# Patient Record
Sex: Male | Born: 1985
Health system: Southern US, Community
[De-identification: ages and names within clinical notes are randomized; demographics above are authoritative.]

---
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
## 2013-12-16 ENCOUNTER — Ambulatory Visit (INDEPENDENT_AMBULATORY_CARE_PROVIDER_SITE_OTHER): Payer: BC Managed Care – PPO

## 2013-12-16 ENCOUNTER — Encounter: Payer: Self-pay | Admitting: Sports Medicine

## 2013-12-16 ENCOUNTER — Ambulatory Visit (INDEPENDENT_AMBULATORY_CARE_PROVIDER_SITE_OTHER): Payer: BC Managed Care – PPO | Admitting: Sports Medicine

## 2013-12-16 VITALS — BP 130/80 | HR 67 | Ht 75.0 in | Wt 206.0 lb

## 2013-12-16 DIAGNOSIS — Z23 Encounter for immunization: Secondary | ICD-10-CM

## 2013-12-16 DIAGNOSIS — Z299 Encounter for prophylactic measures, unspecified: Secondary | ICD-10-CM

## 2013-12-16 DIAGNOSIS — Z136 Encounter for screening for cardiovascular disorders: Secondary | ICD-10-CM

## 2013-12-16 DIAGNOSIS — Z0000 Encounter for general adult medical examination without abnormal findings: Secondary | ICD-10-CM | POA: Insufficient documentation

## 2013-12-16 DIAGNOSIS — Z0289 Encounter for other administrative examinations: Secondary | ICD-10-CM

## 2013-12-16 LAB — POCT URINALYSIS DIPSTICK
Bilirubin, UA: NEGATIVE
Blood, UA: NEGATIVE
Glucose, UA: NEGATIVE
Ketones, UA: NEGATIVE
Leukocytes, UA: NEGATIVE
Nitrite, UA: NEGATIVE
Protein, UA: NEGATIVE
Spec Grav, UA: 1.02
Urobilinogen, UA: 0.2
pH, UA: 7.5

## 2013-12-16 NOTE — Assessment & Plan Note (Addendum)
Currently in [facility] training. Physical performed today. He will need urinalysis, chest x-ray, Tdap, hearing and vision test, EKG. I can see him back in one year.

## 2013-12-16 NOTE — Progress Notes (Signed)
  Subjective:    CC: Establish care.   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 He needs a complete physical, with some routine testing. He has no complaints, and no issues.  Past medical history, Surgical history, Family history not pertinant except as noted below, Social history, Allergies, and medications have been entered into the medical record, reviewed, and no changes needed.   Review of Systems: No headache, visual changes, nausea, vomiting, diarrhea, constipation, dizziness, abdominal pain, skin rash, fevers, chills, night sweats, swollen lymph nodes, weight loss, chest pain, body aches, joint swelling, muscle aches, shortness of breath, mood changes, visual or auditory hallucinations.  Objective:    General: Well Developed, well nourished, and in no acute distress.  Neuro: Alert and oriented x3, extra-ocular muscles intact, sensation grossly intact.  HEENT: Normocephalic, atraumatic, pupils equal round reactive to light, neck supple, no masses, no lymphadenopathy, thyroid nonpalpable.  Skin: Warm and dry, no rashes noted.  Cardiac: Regular rate and rhythm, no murmurs rubs or gallops.  Respiratory: Clear to auscultation bilaterally. Not using accessory muscles, speaking in full sentences.  Abdominal: Soft, nontender, nondistended, positive bowel sounds, no masses, no organomegaly.  Musculoskeletal: Shoulder, elbow, wrist, hip, knee, ankle stable, and with full range of motion.  Urinalysis is negative, twelve-lead EKG shows normal sinus rhythm, hearing, and vision are within normal range.  Chest x-ray is negative.   Impression and Recommendations:    The patient was counselled, risk factors were discussed, anticipatory guidance given.

## 2014-01-14 ENCOUNTER — Encounter: Payer: Self-pay | Admitting: Sports Medicine

## 2014-11-25 IMAGING — CR DG CHEST 2V
2 series · 2 of 2 positions shown · non-contrast
Comparison: None.

CLINICAL DATA: Employment screening.

EXAM:
CHEST  2 VIEW

[view not recorded (1 of 2)]
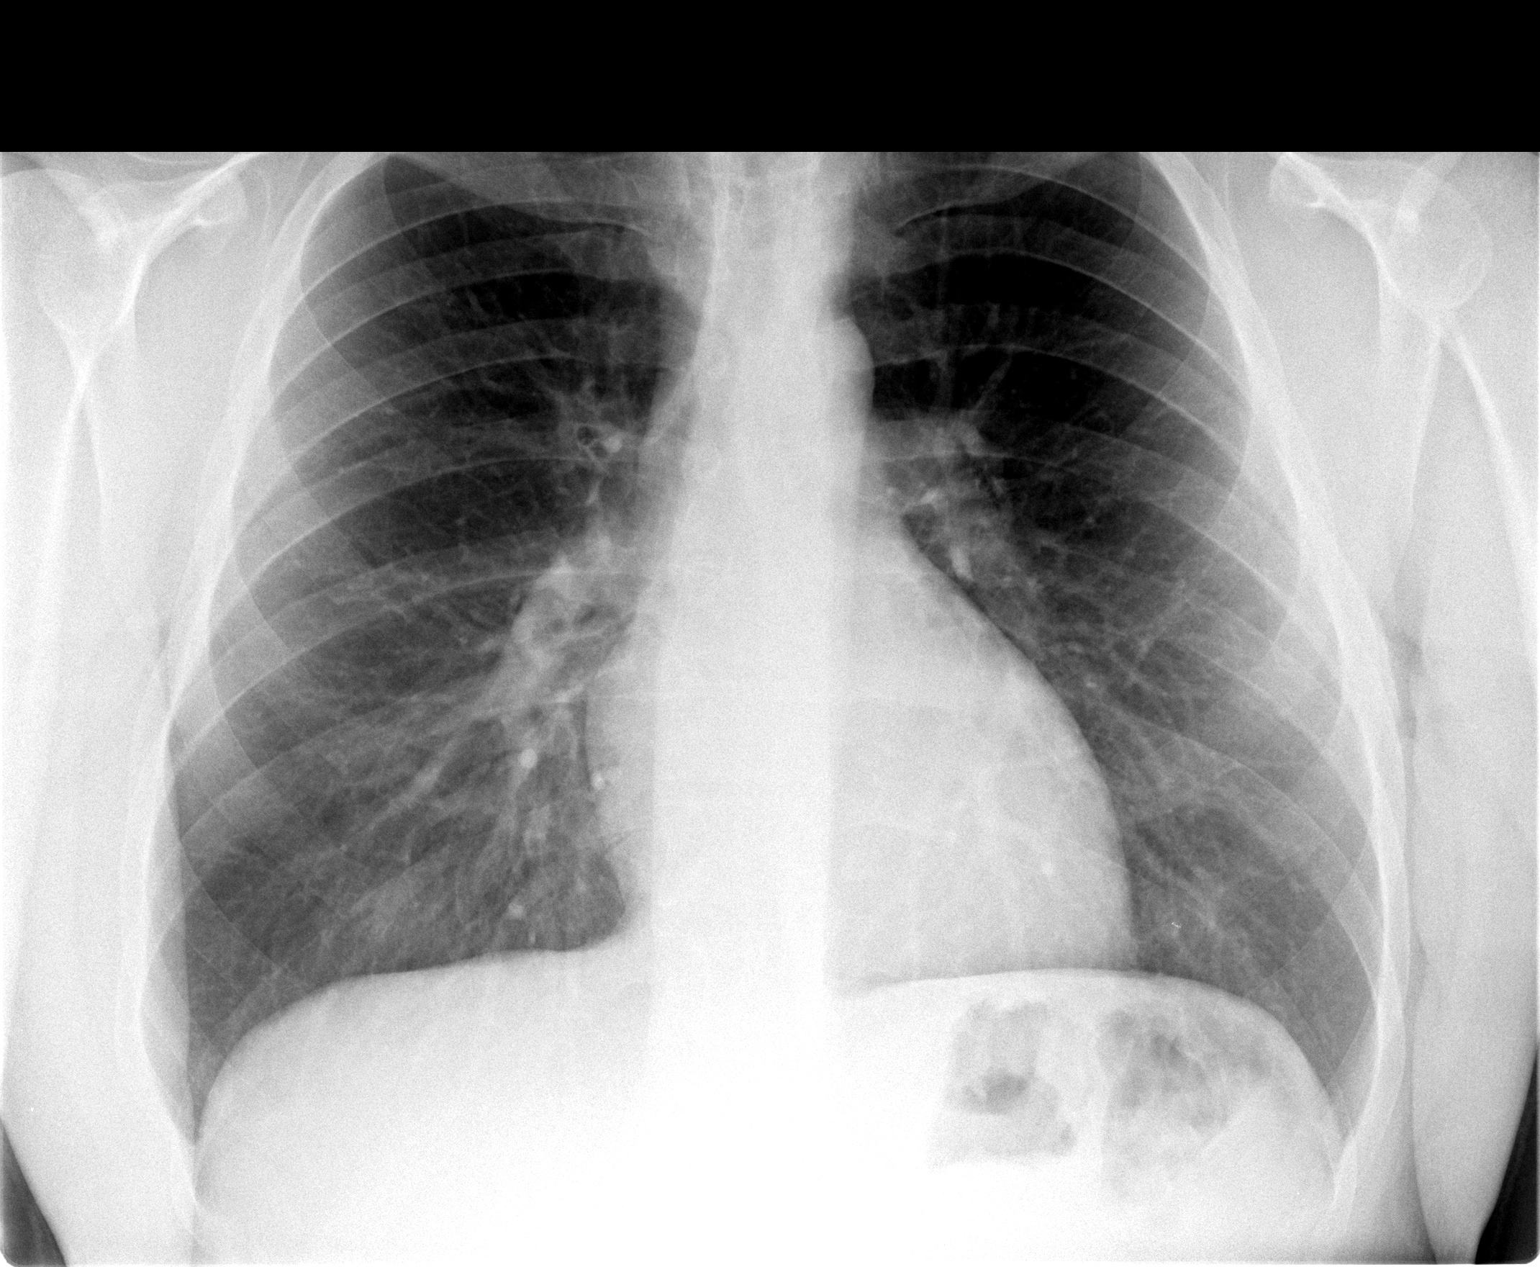

[view not recorded (2 of 2)]
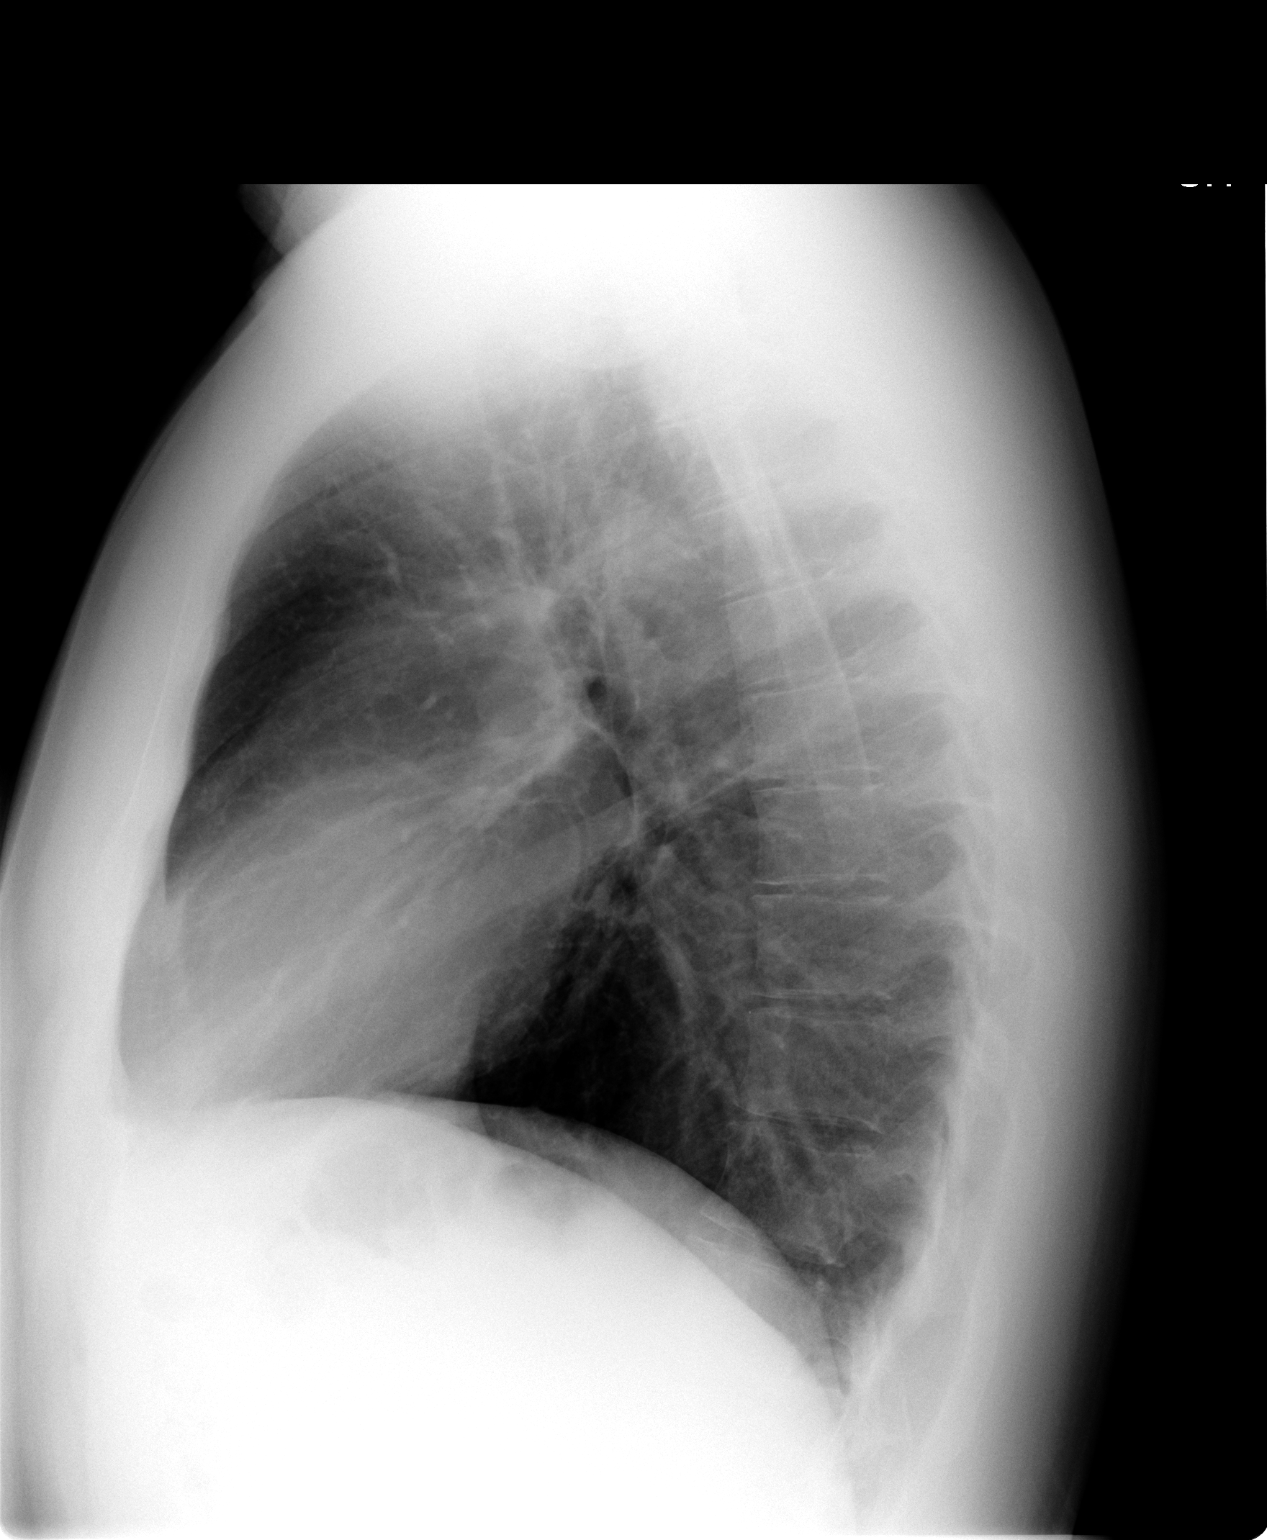

[2 of 2 positions shown; findings below may reference images not displayed]

FINDINGS: The heart size and mediastinal contours are within normal limits.
Both lungs are clear. The visualized skeletal structures are
unremarkable.
IMPRESSION: Normal exam.

## 2014-12-15 ENCOUNTER — Ambulatory Visit (INDEPENDENT_AMBULATORY_CARE_PROVIDER_SITE_OTHER): Payer: BC Managed Care – PPO | Admitting: Physician Assistant

## 2014-12-15 ENCOUNTER — Encounter: Payer: Self-pay | Admitting: Physician Assistant

## 2014-12-15 VITALS — BP 128/82 | HR 71 | Ht 75.0 in | Wt 213.0 lb

## 2014-12-15 DIAGNOSIS — Z299 Encounter for prophylactic measures, unspecified: Secondary | ICD-10-CM

## 2014-12-15 DIAGNOSIS — Z418 Encounter for other procedures for purposes other than remedying health state: Secondary | ICD-10-CM | POA: Diagnosis not present

## 2014-12-15 NOTE — Patient Instructions (Signed)

## 2014-12-15 NOTE — Progress Notes (Signed)
   Subjective:    Patient ID: Patrick Whitehead, male    DOB: 1986-08-10, 28 y.o.   MRN: [id number]  HPI Pt presents to the clinic for CPE for work. No problems or concerns. Feels great.    Review of Systems  All other systems reviewed and are negative.      Objective:   Physical Exam  Constitutional: He is oriented to person, place, and time. He appears well-developed and well-nourished.  HENT:  Head: Normocephalic and atraumatic.  Right Ear: External ear normal.  Left Ear: External ear normal.  Nose: Nose normal.  Mouth/Throat: Oropharynx is clear and moist. No oropharyngeal exudate.  Eyes: Conjunctivae and EOM are normal. Pupils are equal, round, and reactive to light. Right eye exhibits no discharge. Left eye exhibits no discharge.  Neck: Normal range of motion. Neck supple. No thyromegaly present.  Cardiovascular: Normal rate, regular rhythm and normal heart sounds.   Pulmonary/Chest: Effort normal and breath sounds normal. He has no wheezes.  Abdominal: Soft. Bowel sounds are normal. He exhibits no distension and no mass. There is no tenderness. There is no rebound and no guarding.  Musculoskeletal: Normal range of motion.  Lymphadenopathy:    He has no cervical adenopathy.  Neurological: He is alert and oriented to person, place, and time. He has normal reflexes. No cranial nerve deficit. Coordination normal.  Skin: Skin is dry.  Psychiatric: He has a normal mood and affect. His behavior is normal.          Assessment & Plan:  CPE- pt declines flu shot. He had blood work done at work. Discussed with patient to have copy sent to [place] to scan into system. Tdap up to date. HO given. No problems or concerns. Follow up in 1 year.

## 2015-12-10 ENCOUNTER — Encounter: Payer: Self-pay | Admitting: Sports Medicine

## 2015-12-10 ENCOUNTER — Ambulatory Visit (INDEPENDENT_AMBULATORY_CARE_PROVIDER_SITE_OTHER): Payer: BLUE CROSS/BLUE SHIELD | Admitting: Sports Medicine

## 2015-12-10 VITALS — BP 121/84 | HR 73 | Temp 98.3°F | Resp 18 | Ht 75.0 in | Wt 211.3 lb

## 2015-12-10 DIAGNOSIS — Z299 Encounter for prophylactic measures, unspecified: Secondary | ICD-10-CM | POA: Diagnosis not present

## 2015-12-10 NOTE — Assessment & Plan Note (Signed)
Annual physical performed as above, patient has blood work performed by the department. He will for this to me in January. 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

## 2015-12-10 NOTE — Progress Notes (Signed)
  Subjective:    CC: CPE  HPI:  This is a pleasant and healthy 29 year old male with no complaints, He is simply here for a physical exam.he declines getting any vaccinations.  Past medical history, Surgical history, Family history not pertinant except as noted below, Social history, Allergies, and medications have been entered into the medical record, reviewed, and no changes needed.   Review of Systems: No headache, visual changes, nausea, vomiting, diarrhea, constipation, dizziness, abdominal pain, skin rash, fevers, chills, night sweats, swollen lymph nodes, weight loss, chest pain, body aches, joint swelling, muscle aches, shortness of breath, mood changes, visual or auditory hallucinations.  Objective:   General: Well Developed, well nourished, and in no acute distress.  Neuro: Alert and oriented x3, extra-ocular muscles intact, sensation grossly intact. Cranial nerves II through XII are intact, motor, sensory, and coordinative functions are all intact. HEENT: Normocephalic, atraumatic, pupils equal round reactive to light, neck supple, no masses, no lymphadenopathy, thyroid nonpalpable. Oropharynx, nasopharynx, external ear canals are unremarkable. Skin: Warm and dry, no rashes noted.  Cardiac: Regular rate and rhythm, no murmurs rubs or gallops.  Respiratory: Clear to auscultation bilaterally. Not using accessory muscles, speaking in full sentences.  Abdominal: Soft, nontender, nondistended, positive bowel sounds, no masses, no organomegaly.  Musculoskeletal: Shoulder, elbow, wrist, hip, knee, ankle stable, and with full range of motion.  Impression and Recommendations:    The patient was counselled, risk factors were discussed, anticipatory guidance given.

## 2016-07-04 ENCOUNTER — Ambulatory Visit (INDEPENDENT_AMBULATORY_CARE_PROVIDER_SITE_OTHER): Payer: BLUE CROSS/BLUE SHIELD | Admitting: Sports Medicine

## 2016-07-04 ENCOUNTER — Encounter: Payer: Self-pay | Admitting: Sports Medicine

## 2016-07-04 VITALS — BP 129/86 | HR 69 | Resp 18 | Wt 235.6 lb

## 2016-07-04 DIAGNOSIS — Z0000 Encounter for general adult medical examination without abnormal findings: Secondary | ICD-10-CM

## 2016-07-04 NOTE — Progress Notes (Signed)
Patient ID: Patrick Whitehead, male   DOB: 30-Apr-1986, 30 y.o.   MRN: [id number]  Subjective:    CC: Complete physical  HPI: Presenting to clinic for one year complete physical check up.  No complaints at this time.   Past medical history, Surgical history, Family history not pertinant except as noted below, Social history, Allergies, and medications have been entered into the medical record, reviewed, and no changes needed.   Review of Systems: No headache, visual changes, nausea, vomiting, diarrhea, constipation, dizziness, abdominal pain, skin rash, fevers, chills, night sweats, swollen lymph nodes, weight loss, chest pain, body aches, joint swelling, muscle aches, shortness of breath, mood changes, visual or auditory hallucinations.  Objective:    General: Well Developed, well nourished, and in no acute distress.  Neuro: Alert and oriented x3, extra-ocular muscles intact, sensation grossly intact.  HEENT: Normocephalic, atraumatic, pupils equal round reactive to light, neck supple, no masses, no lymphadenopathy, thyroid nonpalpable.  External auditory canals clear.  No pharyngeal exudates.    Skin: Warm and dry, no rashes noted.  Cardiac: Regular rate and rhythm, no murmurs rubs or gallops.  Respiratory: Clear to auscultation bilaterally. Not using accessory muscles, speaking in full sentences.  Abdominal: Soft, nontender, nondistended, positive bowel sounds, no masses, no organomegaly.  Musculoskeletal: Shoulder, elbow, wrist, hip, knee, ankle stable, and with full range of motion.  Impression and Recommendations:    The patient was counselled, risk factors were discussed, anticipatory guidance given.  Recent bloodwork from work was normal.  Will return in one year for yearly physical for work.

## 2016-07-04 NOTE — Assessment & Plan Note (Signed)
CPE as above, gets labs done at his employer, return in 1 year, all labs normal. .

## 2016-07-13 DIAGNOSIS — Z0100 Encounter for examination of eyes and vision without abnormal findings: Secondary | ICD-10-CM | POA: Diagnosis not present

## 2017-08-03 ENCOUNTER — Ambulatory Visit (INDEPENDENT_AMBULATORY_CARE_PROVIDER_SITE_OTHER): Payer: BLUE CROSS/BLUE SHIELD | Admitting: Sports Medicine

## 2017-08-03 ENCOUNTER — Encounter: Payer: Self-pay | Admitting: Sports Medicine

## 2017-08-03 DIAGNOSIS — Z0000 Encounter for general adult medical examination without abnormal findings: Secondary | ICD-10-CM | POA: Diagnosis not present

## 2017-08-03 DIAGNOSIS — Z3009 Encounter for other general counseling and advice on contraception: Secondary | ICD-10-CM | POA: Diagnosis not present

## 2017-08-03 DIAGNOSIS — D229 Melanocytic nevi, unspecified: Secondary | ICD-10-CM

## 2017-08-03 DIAGNOSIS — L989 Disorder of the skin and subcutaneous tissue, unspecified: Secondary | ICD-10-CM | POA: Insufficient documentation

## 2017-08-03 DIAGNOSIS — D239 Other benign neoplasm of skin, unspecified: Secondary | ICD-10-CM | POA: Insufficient documentation

## 2017-08-03 NOTE — Assessment & Plan Note (Signed)
Physical exam today. His employer does his cholesterol screening, his numbers were normal from February.  Return in one year.

## 2017-08-03 NOTE — Progress Notes (Signed)
  Subjective:    CC: Annual physical   HPI:  Patrick Whitehead is here for a physical, he has no complaints.  Family planning: Has 2 kids, would like a vasectomy.  Past medical history:  Negative.  See flowsheet/record as well for more information.  Surgical history: Negative.  See flowsheet/record as well for more information.  Family history: Negative.  See flowsheet/record as well for more information.  Social history: Negative.  See flowsheet/record as well for more information.  Allergies, and medications have been entered into the medical record, reviewed, and no changes needed.    Review of Systems: No headache, visual changes, nausea, vomiting, diarrhea, constipation, dizziness, abdominal pain, skin rash, fevers, chills, night sweats, swollen lymph nodes, weight loss, chest pain, body aches, joint swelling, muscle aches, shortness of breath, mood changes, visual or auditory hallucinations.  Objective:    General: Well Developed, well nourished, and in no acute distress.  Neuro: Alert and oriented x3, extra-ocular muscles intact, sensation grossly intact. Cranial nerves II through XII are intact, motor, sensory, and coordinative functions are all intact. HEENT: Normocephalic, atraumatic, pupils equal round reactive to light, neck supple, no masses, no lymphadenopathy, thyroid nonpalpable. Oropharynx, nasopharynx, external ear canals are unremarkable. Skin: Warm and dry, no rashes noted. On the left upper back there is a 1.5 cm variegated hyperpigmented likely atypical nevus. Cardiac: Regular rate and rhythm, no murmurs rubs or gallops.  Respiratory: Clear to auscultation bilaterally. Not using accessory muscles, speaking in full sentences.  Abdominal: Soft, nontender, nondistended, positive bowel sounds, no masses, no organomegaly.  Musculoskeletal: Shoulder, elbow, wrist, hip, knee, ankle stable, and with full range of motion.  Impression and Recommendations:    The patient was counselled,  risk factors were discussed, anticipatory guidance given.  Annual physical exam Physical exam today. His employer does his cholesterol screening, his numbers were normal from February.  Return in one year.  Nevus Located on the back, somewhat atypical appearing. Return for shave biopsy.  Family planning Referral for vasectomy per patient request.

## 2017-08-03 NOTE — Assessment & Plan Note (Signed)
Referral for vasectomy per patient request.

## 2017-08-03 NOTE — Assessment & Plan Note (Signed)
Located on the back, somewhat atypical appearing. Return for shave biopsy.

## 2017-08-09 ENCOUNTER — Encounter: Payer: Self-pay | Admitting: Sports Medicine

## 2017-08-09 ENCOUNTER — Ambulatory Visit (INDEPENDENT_AMBULATORY_CARE_PROVIDER_SITE_OTHER): Payer: BLUE CROSS/BLUE SHIELD | Admitting: Sports Medicine

## 2017-08-09 DIAGNOSIS — D225 Melanocytic nevi of trunk: Secondary | ICD-10-CM | POA: Diagnosis not present

## 2017-08-09 DIAGNOSIS — D229 Melanocytic nevi, unspecified: Secondary | ICD-10-CM

## 2017-08-09 MED ORDER — HYDROCODONE-ACETAMINOPHEN 5-325 MG PO TABS: 1.0000 | ORAL_TABLET | Freq: Three times a day (TID) | ORAL | 0 refills | Status: DC | PRN

## 2017-08-09 NOTE — Progress Notes (Signed)
   Procedure:  Excision of 2cm atypical nevus left upper back Risks, benefits, and alternatives explained and consent obtained. Time out conducted. Surface prepped with alcohol. 5cc lidocaine with epinephine infiltrated in a field block. Adequate anesthesia ensured. Area prepped and draped in a sterile fashion. Excision performed with: Elliptical incision with 1 cm margins made around the lesion, I carried the dissection down into the subcutaneous tissues, the lesion was removed with a suture marking the medial border. I then closed the incision with 3-0 running subcuticular Vicryl sutures.  Hemostasis achieved. Pt stable.

## 2017-08-09 NOTE — Addendum Note (Signed)
Addended by: Elizabeth Sauer on: 08/09/2017 11:03 AM   Modules accepted: Orders

## 2017-08-09 NOTE — Assessment & Plan Note (Signed)
Surgical removal, awaiting pathology results return in one week for a wound check, hydrocodone for postprocedural pain.Marland Kitchen

## 2017-08-16 ENCOUNTER — Ambulatory Visit (INDEPENDENT_AMBULATORY_CARE_PROVIDER_SITE_OTHER): Payer: BLUE CROSS/BLUE SHIELD | Admitting: Sports Medicine

## 2017-08-16 ENCOUNTER — Encounter: Payer: Self-pay | Admitting: Sports Medicine

## 2017-08-16 DIAGNOSIS — D239 Other benign neoplasm of skin, unspecified: Secondary | ICD-10-CM

## 2017-08-16 NOTE — Progress Notes (Signed)
.   Subjective: 1 week post surgical excision with primary closure. Owing well.   Objective: General: Well-developed, well-nourished, and in no acute distress. Incision: Clean, dry, intact. Skin: There is a number lesion, 4 cm across, pigmented but with clear margins, non-variegated.    Assessment/plan:   Dysplastic nevus Pathology results showed full excision with clear margins of a dysplastic nevus with severe atypia. Incision looks good today, he did have another lesion on his right posterior upper shoulder, he will keep an eye on this. If it changes over 6 months we will do an excisional biopsy.

## 2017-08-16 NOTE — Assessment & Plan Note (Signed)
Pathology results showed full excision with clear margins of a dysplastic nevus with severe atypia. Incision looks good today, he did have another lesion on his right posterior upper shoulder, he will keep an eye on this. If it changes over 6 months we will do an excisional biopsy.

## 2017-10-05 DIAGNOSIS — Z3009 Encounter for other general counseling and advice on contraception: Secondary | ICD-10-CM | POA: Diagnosis not present

## 2018-01-16 DIAGNOSIS — Z302 Encounter for sterilization: Secondary | ICD-10-CM | POA: Diagnosis not present

## 2018-04-03 ENCOUNTER — Encounter: Payer: BLUE CROSS/BLUE SHIELD | Admitting: Sports Medicine

## 2018-04-10 ENCOUNTER — Ambulatory Visit (INDEPENDENT_AMBULATORY_CARE_PROVIDER_SITE_OTHER): Payer: BLUE CROSS/BLUE SHIELD

## 2018-04-10 ENCOUNTER — Encounter: Payer: Self-pay | Admitting: Sports Medicine

## 2018-04-10 ENCOUNTER — Ambulatory Visit (INDEPENDENT_AMBULATORY_CARE_PROVIDER_SITE_OTHER): Payer: BLUE CROSS/BLUE SHIELD | Admitting: Sports Medicine

## 2018-04-10 DIAGNOSIS — Z021 Encounter for pre-employment examination: Secondary | ICD-10-CM | POA: Diagnosis not present

## 2018-04-10 DIAGNOSIS — Z0000 Encounter for general adult medical examination without abnormal findings: Secondary | ICD-10-CM | POA: Diagnosis not present

## 2018-04-10 DIAGNOSIS — R7989 Other specified abnormal findings of blood chemistry: Secondary | ICD-10-CM | POA: Diagnosis not present

## 2018-04-10 NOTE — Progress Notes (Signed)
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  He has no complaints.  I reviewed the past medical history, family history, social history, surgical history, and allergies today and no changes were needed.  Please see the problem list section below in epic for further details.  Past Medical History: No past medical history on file. Past Surgical History: Past Surgical History:  Procedure Laterality Date  . TONSILLECTOMY  2000   Social History: Social History   Socioeconomic History  . Marital status: Married    Spouse name: Not on file  . Number of children: Not on file  . Years of education: Not on file  . Highest education level: Not on file  Occupational History  . Not on file  Social Needs  . Financial resource strain: Not on file  . Food insecurity:    Worry: Not on file    Inability: Not on file  . Transportation needs:    Medical: Not on file    Non-medical: Not on file  Tobacco Use  . Smoking status: Never Smoker  . Smokeless tobacco: Never Used  Substance and Sexual Activity  . Alcohol use: Not on file  . Drug use: Not on file  . Sexual activity: Not on file  Lifestyle  . Physical activity:    Days per week: Not on file    Minutes per session: Not on file  . Stress: Not on file  Relationships  . Social connections:    Talks on phone: Not on file    Gets together: Not on file    Attends religious service: Not on file    Active member of club or organization: Not on file    Attends meetings of clubs or organizations: Not on file    Relationship status: Not on file  Other Topics Concern  . Not on file  Social History Narrative  . Not on file   Family History: Family History  Problem Relation Age of Onset  . Cancer Paternal Uncle   . Hypertension Maternal Grandmother   . Hypertension  Maternal Grandfather    Allergies: No Known Allergies Medications: See med rec.  Review of Systems: No headache, visual changes, nausea, vomiting, diarrhea, constipation, dizziness, abdominal pain, skin rash, fevers, chills, night sweats, swollen lymph nodes, weight loss, chest pain, body aches, joint swelling, muscle aches, shortness of breath, mood changes, visual or auditory hallucinations.  Objective:    General: Well Developed, well nourished, and in no acute distress.  Neuro: Alert and oriented x3, extra-ocular muscles intact, sensation grossly intact. Cranial nerves II through XII are intact, motor, sensory, and coordinative functions are all intact. HEENT: Normocephalic, atraumatic, pupils equal round reactive to light, neck supple, no masses, no lymphadenopathy, thyroid nonpalpable. Oropharynx, nasopharynx, external ear canals are unremarkable. Skin: Warm and dry, no rashes noted.  Cardiac: Regular rate and rhythm, no murmurs rubs or gallops.  Respiratory: Clear to auscultation bilaterally. Not using accessory muscles, speaking in full sentences.  Abdominal: Soft, nontender, nondistended, positive bowel sounds, no masses, no organomegaly.  Musculoskeletal: Shoulder, elbow, wrist, hip, knee, ankle stable, and with full range of motion.  Normal twelve-lead ECG.  Urinalysis negative except for trace ketones, this can be normal in someone fasting.  [place] color vision testing is normal.  Normal audiometry screening.  Normal distance and near visual acuity testing.  Normal chest x-ray.  Impression and Recommendations:    The patient was counselled, risk factors were discussed, anticipatory guidance given.  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 Return in 1 year. He does tell me his LDL was profoundly elevated, he does not desire to discuss any  cholesterol medication, has come off of the keto diet for now and will have his lipids checked in January and forward the results to me.  ___________________________________________ Gwen Her. Dianah Field, M.D., ABFM., CAQSM. Primary Care and [place] Instructor of [employer] [facility] [employer]

## 2018-04-10 NOTE — Assessment & Plan Note (Signed)
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 Return in 1 year. He does tell me his LDL was profoundly elevated, he does not desire to discuss any cholesterol medication, has come off of the keto diet for now and will have his lipids checked in January and forward the results to me.

## 2018-04-11 LAB — ANEMIA PROFILE B
%SAT: 26 % (ref 15–60)
ABS Retic: 50500 cells/uL (ref 25000–9000)
Basophils Absolute: 18 cells/uL (ref 0–200)
Basophils Relative: 0.4 %
Eosinophils Absolute: 81 cells/uL (ref 15–500)
Eosinophils Relative: 1.8 %
Ferritin: 221 ng/mL (ref 20–345)
Folate: >24 ng/mL
HCT: 43.9 % (ref 38.5–50.0)
Hemoglobin: 14.5 g/dL (ref 13.2–17.1)
Iron: 61 ug/dL (ref 50–180)
Lymphs Abs: 1071 cells/uL (ref 850–3900)
MCH: 28.3 pg (ref 27.0–33.0)
MCHC: 33 g/dL (ref 32.0–36.0)
MCV: 85.6 fL (ref 80.0–100.0)
MPV: 10.6 fL (ref 7.5–12.5)
Monocytes Relative: 8.1 %
Neutro Abs: 2966 {cells}/uL (ref 1500–7800)
Neutrophils Relative %: 65.9 %
Platelets: 179 Thousand/uL (ref 140–400)
RBC: 5.13 10*6/uL (ref 4.20–5.80)
RDW: 12.5 % (ref 11.0–15.0)
Retic Ct Pct: 1 %
TIBC: 231 ug/dL — ABNORMAL LOW (ref 250–425)
Total Lymphocyte: 23.8 %
Vitamin B-12: 1162 pg/mL — ABNORMAL HIGH (ref 200–1100)
WBC mixed population: 365 cells/uL (ref 200–950)
WBC: 4.5 10*3/uL (ref 3.8–10.8)

## 2018-04-11 LAB — COMPREHENSIVE METABOLIC PANEL
AG Ratio: 2.2 (calc) (ref 1.0–2.5)
Albumin: 4.6 g/dL (ref 3.6–5.1)
CO2: 30 mmol/L (ref 20–32)
Chloride: 106 mmol/L (ref 98–110)
Creat: 1.03 mg/dL (ref 0.60–1.35)
Globulin: 2.1 g/dL (calc) (ref 1.9–3.7)
Glucose, Bld: 95 mg/dL (ref 65–99)
Potassium: 5 mmol/L (ref 3.5–5.3)
Total Protein: 6.7 g/dL (ref 6.1–8.1)

## 2018-04-11 LAB — COMPREHENSIVE METABOLIC PANEL WITH GFR
ALT: 15 U/L (ref 9–46)
AST: 14 U/L (ref 10–40)
Alkaline phosphatase (APISO): 40 U/L (ref 40–115)
BUN: 20 mg/dL (ref 7–25)
Calcium: 9.6 mg/dL (ref 8.6–10.3)
Sodium: 141 mmol/L (ref 135–146)
Total Bilirubin: 0.5 mg/dL (ref 0.2–1.2)

## 2018-04-11 LAB — VITAMIN D 25 HYDROXY (VIT D DEFICIENCY, FRACTURES): Vit D, 25-Hydroxy: 49 ng/mL (ref 30–100)

## 2018-04-16 ENCOUNTER — Encounter: Payer: BLUE CROSS/BLUE SHIELD | Admitting: Sports Medicine

## 2018-05-08 ENCOUNTER — Encounter: Payer: Self-pay | Admitting: Sports Medicine

## 2018-05-09 NOTE — Telephone Encounter (Signed)
Leta would you email him the actual decibel values on his hearing test?  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  Thank you so much!

## 2019-03-04 LAB — LIPID PANEL
Cholesterol: 183 (ref 0–200)
HDL: 70 (ref 35–70)
LDL Cholesterol: 103
Triglycerides: 50 (ref 40–160)

## 2019-03-20 IMAGING — DX DG CHEST 2V
2 series · 2 of 2 positions shown · non-contrast
Comparison: 12/16/2013

CLINICAL DATA: Screening, pre-employment

EXAM:
CHEST - 2 VIEW

[chest pa]
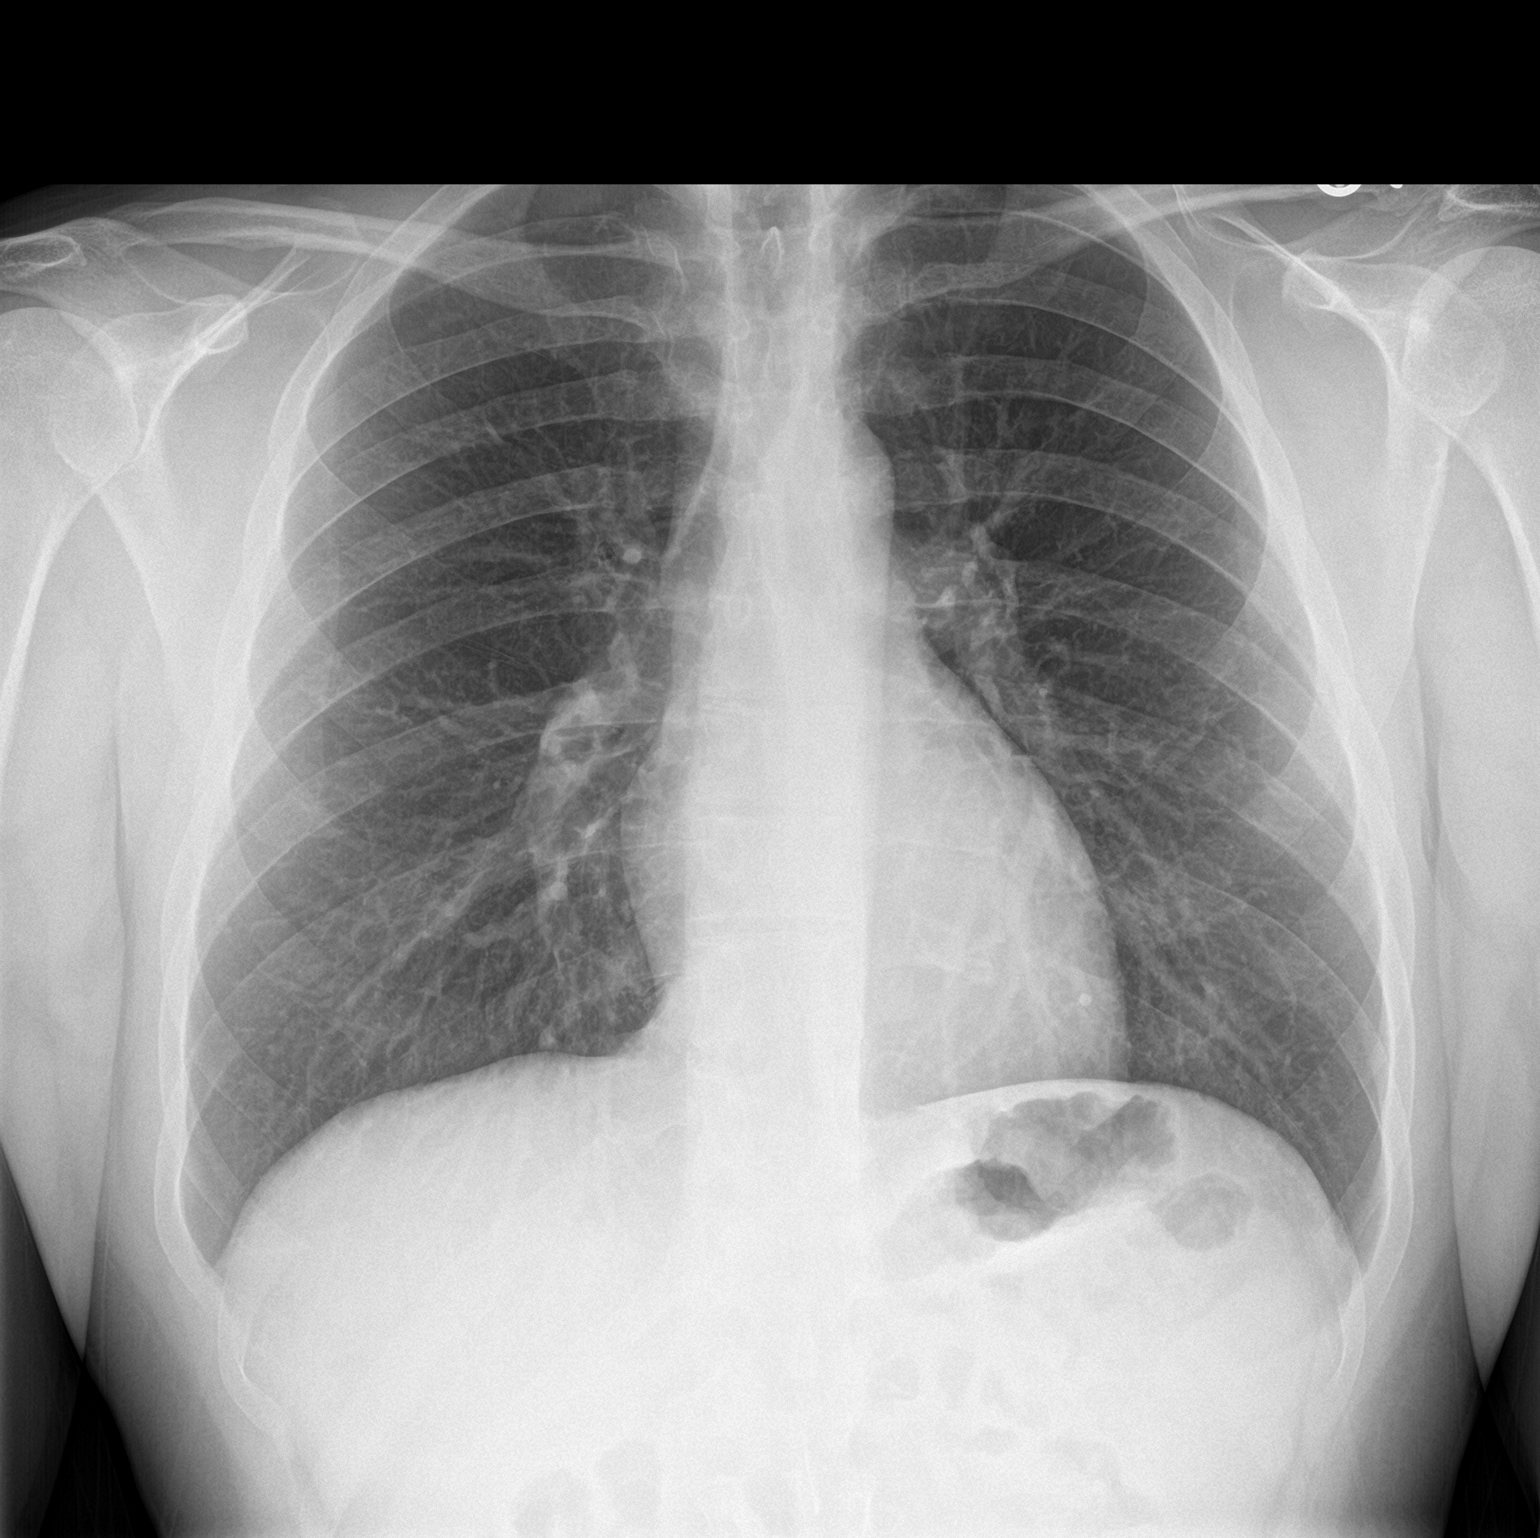

[chest lat]
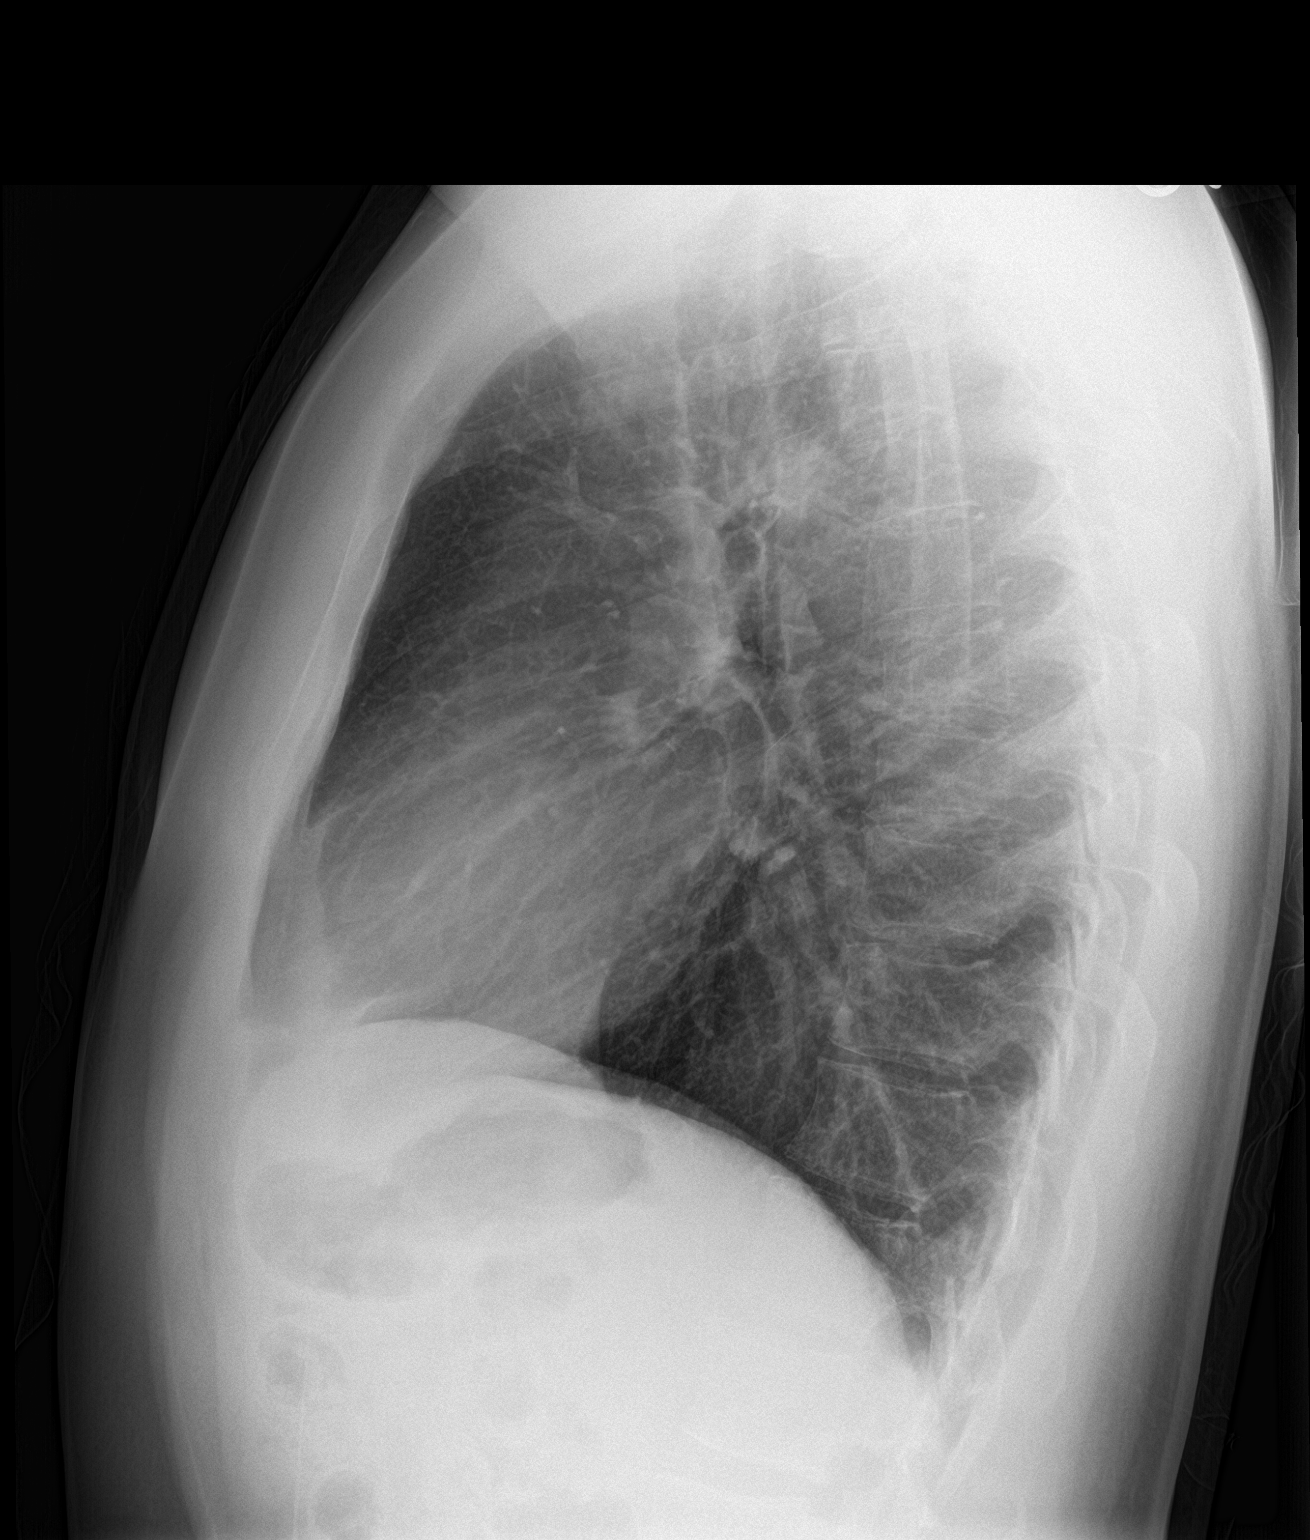

[2 of 2 positions shown; findings below may reference images not displayed]

FINDINGS: Heart and mediastinal contours are within normal limits. No focal
opacities or effusions. No acute bony abnormality.
IMPRESSION: No active cardiopulmonary disease.

## 2019-08-05 ENCOUNTER — Ambulatory Visit (INDEPENDENT_AMBULATORY_CARE_PROVIDER_SITE_OTHER): Payer: BC Managed Care – PPO | Admitting: Sports Medicine

## 2019-08-05 ENCOUNTER — Other Ambulatory Visit: Payer: Self-pay

## 2019-08-05 ENCOUNTER — Encounter: Payer: Self-pay | Admitting: Sports Medicine

## 2019-08-05 DIAGNOSIS — Z0000 Encounter for general adult medical examination without abnormal findings: Secondary | ICD-10-CM | POA: Diagnosis not present

## 2019-08-05 NOTE — Progress Notes (Signed)
Subjective:    CC: Annual physical exam  HPI:  This is a pleasant 33 year old male, he works for the [employer], he is doing well, he is healthy, he has no complaints.  I reviewed the past medical history, family history, social history, surgical history, and allergies today and no changes were needed.  Please see the problem list section below in epic for further details.  Past Medical History: No past medical history on file. Past Surgical History: Past Surgical History:  Procedure Laterality Date  . TONSILLECTOMY  2000   Social History: Social History   Socioeconomic History  . Marital status: Married    Spouse name: Not on file  . Number of children: Not on file  . Years of education: Not on file  . Highest education level: Not on file  Occupational History  . Not on file  Social Needs  . Financial resource strain: Not on file  . Food insecurity    Worry: Not on file    Inability: Not on file  . Transportation needs    Medical: Not on file    Non-medical: Not on file  Tobacco Use  . Smoking status: Never Smoker  . Smokeless tobacco: Never Used  Substance and Sexual Activity  . Alcohol use: Not on file  . Drug use: Not on file  . Sexual activity: Not on file  Lifestyle  . Physical activity    Days per week: Not on file    Minutes per session: Not on file  . Stress: Not on file  Relationships  . Social Herbalist on phone: Not on file    Gets together: Not on file    Attends religious service: Not on file    Active member of club or organization: Not on file    Attends meetings of clubs or organizations: Not on file    Relationship status: Not on file  Other Topics Concern  . Not on file  Social History Narrative  . Not on file   Family History: Family History  Problem Relation Age of Onset  . Cancer Paternal Uncle   . Hypertension Maternal Grandmother   . Hypertension Maternal Grandfather    Allergies:  No Known Allergies Medications: See med rec.  Review of Systems: No headache, visual changes, nausea, vomiting, diarrhea, constipation, dizziness, abdominal pain, skin rash, fevers, chills, night sweats, swollen lymph nodes, weight loss, chest pain, body aches, joint swelling, muscle aches, shortness of breath, mood changes, visual or auditory hallucinations.  Objective:    General: Well Developed, well nourished, and in no acute distress.  Neuro: Alert and oriented x3, extra-ocular muscles intact, sensation grossly intact. Cranial nerves II through XII are intact, motor, sensory, and coordinative functions are all intact. HEENT: Normocephalic, atraumatic, pupils equal round reactive to light, neck supple, no masses, no lymphadenopathy, thyroid nonpalpable. Oropharynx, nasopharynx, external ear canals are unremarkable. Skin: Warm and dry, no rashes noted.  There is a subcentimeter well-defined, rounded hyperpigmented lesion on his right upper back, this appears to be a benign nevus.  Edges are regular, coloration is non-variegated.  He has not noticed any change. Cardiac: Regular rate and rhythm, no murmurs rubs or gallops.  Respiratory: Clear to auscultation bilaterally. Not using accessory muscles, speaking in full sentences.  Abdominal: Soft, nontender, nondistended, positive bowel sounds, no masses, no organomegaly.  Musculoskeletal: Shoulder, elbow, wrist, hip, knee, ankle stable, and with full range of motion.  Impression and Recommendations:  The patient was counselled, risk factors were discussed, anticipatory guidance given.  Annual physical exam Unremarkable physical, return in 1 year.   ___________________________________________ Gwen Her. Dianah Field, M.D., ABFM., CAQSM. Primary Care and Sports Medicine [facility]  Adjunct Professor of [place] [employer] [facility] of Medicine

## 2019-08-05 NOTE — Assessment & Plan Note (Signed)
Unremarkable physical, return in 1 year.

## 2019-09-26 ENCOUNTER — Encounter: Payer: Self-pay | Admitting: Sports Medicine

## 2019-09-27 DIAGNOSIS — Z23 Encounter for immunization: Secondary | ICD-10-CM | POA: Diagnosis not present

## 2019-11-28 DIAGNOSIS — J209 Acute bronchitis, unspecified: Secondary | ICD-10-CM | POA: Diagnosis not present

## 2019-11-28 DIAGNOSIS — Z20828 Contact with and (suspected) exposure to other viral communicable diseases: Secondary | ICD-10-CM | POA: Diagnosis not present

## 2020-08-03 NOTE — Telephone Encounter (Signed)
Lets get him in, probably next Tuesday for a biopsy of this lesion.  I do have an 830 slot open.

## 2020-08-04 NOTE — Telephone Encounter (Signed)
PCP approved for physical with a punch biopsy. Visit type has been changed.

## 2020-08-04 NOTE — Telephone Encounter (Signed)
Patient states that he wanted to come in for a physical and the biopsy of the lesion. States that Dr.Thekkekandam has done this before in one visit. I offered patient a 15 min appointment next week  for the biopsy, he wanted me to send a message to make sure both can be done in this time period. If not I will call him back to change it. Please advise.

## 2020-08-04 NOTE — Telephone Encounter (Signed)
That is fine, I think it would just be a punch biopsy and was because they told him he could do just 1 total nucleated physical and a punch biopsy some paperwork.

## 2020-08-12 ENCOUNTER — Other Ambulatory Visit: Payer: Self-pay | Admitting: Sports Medicine

## 2020-08-12 ENCOUNTER — Ambulatory Visit (INDEPENDENT_AMBULATORY_CARE_PROVIDER_SITE_OTHER): Payer: BC Managed Care – PPO | Admitting: Sports Medicine

## 2020-08-12 ENCOUNTER — Other Ambulatory Visit: Payer: Self-pay

## 2020-08-12 ENCOUNTER — Encounter: Payer: Self-pay | Admitting: Sports Medicine

## 2020-08-12 DIAGNOSIS — Z0000 Encounter for general adult medical examination without abnormal findings: Secondary | ICD-10-CM | POA: Diagnosis not present

## 2020-08-12 DIAGNOSIS — R030 Elevated blood-pressure reading, without diagnosis of hypertension: Secondary | ICD-10-CM | POA: Insufficient documentation

## 2020-08-12 DIAGNOSIS — D1801 Hemangioma of skin and subcutaneous tissue: Secondary | ICD-10-CM | POA: Diagnosis not present

## 2020-08-12 DIAGNOSIS — L989 Disorder of the skin and subcutaneous tissue, unspecified: Secondary | ICD-10-CM

## 2020-08-12 NOTE — Addendum Note (Signed)
Addended by: Jamesetta So on: 08/12/2020 09:41 AM   Modules accepted: Orders

## 2020-08-12 NOTE — Assessment & Plan Note (Addendum)
Annual physical as above, blood pressure is low but elevated. He gets his labs checked with his employer, and will have them forwarded to me.

## 2020-08-12 NOTE — Assessment & Plan Note (Signed)
Shave biopsy as above 

## 2020-08-12 NOTE — Progress Notes (Signed)
Subjective:    CC: Annual Physical Exam  HPI:  This patient is here for their annual physical  I reviewed the past medical history, family history, social history, surgical history, and allergies today and no changes were needed.  Please see the problem list section below in epic for further details.  Past Medical History: No past medical history on file. Past Surgical History: Past Surgical History:  Procedure Laterality Date  . TONSILLECTOMY  2000   Social History: Social History   Socioeconomic History  . Marital status: Married    Spouse name: Not on file  . Number of children: Not on file  . Years of education: Not on file  . Highest education level: Not on file  Occupational History  . Not on file  Tobacco Use  . Smoking status: Never Smoker  . Smokeless tobacco: Never Used  Substance and Sexual Activity  . Alcohol use: Not on file  . Drug use: Not on file  . Sexual activity: Not on file  Other Topics Concern  . Not on file  Social History Narrative  . Not on file   Social Determinants of Health   Financial Resource Strain:   . Difficulty of Paying Living Expenses:   Food Insecurity:   . Worried About Charity fundraiser in the Last Year:   . Arboriculturist in the Last Year:   Transportation Needs:   . Film/video editor (Medical):   Marland Kitchen Lack of Transportation (Non-Medical):   Physical Activity:   . Days of Exercise per Week:   . Minutes of Exercise per Session:   Stress:   . Feeling of Stress :   Social Connections:   . Frequency of Communication with Friends and Family:   . Frequency of Social Gatherings with Friends and Family:   . Attends Religious Services:   . Active Member of Clubs or Organizations:   . Attends Archivist Meetings:   Marland Kitchen Marital Status:    Family History: Family History  Problem Relation Age of Onset  . Cancer Paternal Uncle   . Hypertension Maternal Grandmother   . Hypertension Maternal Grandfather     Allergies: No Known Allergies Medications: See med rec.  Review of Systems: No headache, visual changes, nausea, vomiting, diarrhea, constipation, dizziness, abdominal pain, skin rash, fevers, chills, night sweats, swollen lymph nodes, weight loss, chest pain, body aches, joint swelling, muscle aches, shortness of breath, mood changes, visual or auditory hallucinations.  Objective:    General: Well Developed, well nourished, and in no acute distress.  Neuro: Alert and oriented x3, extra-ocular muscles intact, sensation grossly intact. Cranial nerves II through XII are intact, motor, sensory, and coordinative functions are all intact. HEENT: Normocephalic, atraumatic, pupils equal round reactive to light, neck supple, no masses, no lymphadenopathy, thyroid nonpalpable. Oropharynx, nasopharynx, external ear canals are unremarkable. Skin: Warm and dry, no rashes noted except as below.  Cardiac: Regular rate and rhythm, no murmurs rubs or gallops.  Respiratory: Clear to auscultation bilaterally. Not using accessory muscles, speaking in full sentences.  Abdominal: Soft, nontender, nondistended, positive bowel sounds, no masses, no organomegaly.  Musculoskeletal: Shoulder, elbow, wrist, hip, knee, ankle stable, and with full range of motion.     Procedure: Shave biopsy of 0.7 cm skin lesion on abdomen, the overlying hyperpigmented blood blister had fallen off, so we did a shave into the deep dermis.  Risks, benefits, and alternatives explained and consent obtained. Time out conducted. Surface prepped with alcohol.  1cc lidocaine with epinephine infiltrated in a field block. Adequate anesthesia ensured. Area prepped and draped in a sterile fashion. Excision performed with: DermaBlade Hemostasis achieved with Hyfrecator. Pt stable.  Impression and Recommendations:    The patient was counselled, risk factors were discussed, anticipatory guidance given.  Annual physical exam Annual  physical as above, blood pressure is low but elevated. He gets his labs checked with his employer, and will have them forwarded to me.  Skin lesion Shave biopsy as above.   Elevated blood pressure reading BP was elevated on initial and recheck. He needs to check it at home, and cut back on sodium.    ___________________________________________ Gwen Her. Dianah Field, M.D., ABFM., CAQSM. Primary Care and Sports Medicine [place] [facility]  Adjunct Professor of [place] [employer] [facility] of Medicine

## 2020-08-12 NOTE — Assessment & Plan Note (Signed)
BP was elevated on initial and recheck. He needs to check it at home, and cut back on sodium.

## 2020-08-17 ENCOUNTER — Encounter: Payer: Self-pay | Admitting: Sports Medicine

## 2021-03-02 ENCOUNTER — Ambulatory Visit: Payer: BC Managed Care – PPO | Admitting: Sports Medicine

## 2021-07-26 ENCOUNTER — Ambulatory Visit (INDEPENDENT_AMBULATORY_CARE_PROVIDER_SITE_OTHER): Payer: Managed Care, Other (non HMO) | Admitting: Sports Medicine

## 2021-07-26 ENCOUNTER — Encounter: Payer: Self-pay | Admitting: Sports Medicine

## 2021-07-26 ENCOUNTER — Other Ambulatory Visit: Payer: Self-pay

## 2021-07-26 VITALS — BP 136/85 | HR 74 | Ht 75.0 in | Wt 206.0 lb

## 2021-07-26 DIAGNOSIS — E782 Mixed hyperlipidemia: Secondary | ICD-10-CM

## 2021-07-26 DIAGNOSIS — Z0000 Encounter for general adult medical examination without abnormal findings: Secondary | ICD-10-CM | POA: Diagnosis not present

## 2021-07-26 NOTE — Assessment & Plan Note (Addendum)
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 I filled out 2 physical forms today.

## 2021-07-26 NOTE — Progress Notes (Signed)
  Subjective:    CC: Annual Physical Exam  HPI:  This patient is here for their annual physical  I reviewed the past medical history, family history, social history, surgical history, and allergies today and no changes were needed.  Please see the problem list section below in epic for further details.  Past Medical History: No past medical history on file. Past Surgical History: Past Surgical History:  Procedure Laterality Date   TONSILLECTOMY  2000   Social History: Social History   Socioeconomic History   Marital status: Married    Spouse name: Not on file   Number of children: Not on file   Years of education: Not on file   Highest education level: Not on file  Occupational History   Not on file  Tobacco Use   Smoking status: Never   Smokeless tobacco: Never  Substance and Sexual Activity   Alcohol use: Not on file   Drug use: Not on file   Sexual activity: Not on file  Other Topics Concern   Not on file  Social History Narrative   Not on file   Social Determinants of Health   Financial Resource Strain: Not on file  Food Insecurity: Not on file  Transportation Needs: Not on file  Physical Activity: Not on file  Stress: Not on file  Social Connections: Not on file   Family History: Family History  Problem Relation Age of Onset   Cancer Paternal Uncle    Hypertension Maternal Grandmother    Hypertension Maternal Grandfather    Allergies: No Known Allergies Medications: See med rec.  Review of Systems: No headache, visual changes, nausea, vomiting, diarrhea, constipation, dizziness, abdominal pain, skin rash, fevers, chills, night sweats, swollen lymph nodes, weight loss, chest pain, body aches, joint swelling, muscle aches, shortness of breath, mood changes, visual or auditory hallucinations.  Objective:    General: Well Developed, well nourished, and in no acute distress.  Neuro: Alert and oriented x3, extra-ocular muscles intact, sensation grossly  intact. Cranial nerves II through XII are intact, motor, sensory, and coordinative functions are all intact. HEENT: Normocephalic, atraumatic, pupils equal round reactive to light, neck supple, no masses, no lymphadenopathy, thyroid nonpalpable. Oropharynx, nasopharynx, external ear canals are unremarkable. Skin: Warm and dry, no rashes noted.  Cardiac: Regular rate and rhythm, no murmurs rubs or gallops.  Respiratory: Clear to auscultation bilaterally. Not using accessory muscles, speaking in full sentences.  Abdominal: Soft, nontender, nondistended, positive bowel sounds, no masses, no organomegaly.  Musculoskeletal: Shoulder, elbow, wrist, hip, knee, ankle stable, and with full range of motion.  Twelve-lead ECG performed and interpreted, normal sinus rhythm, normal axis, no PR changes, no ST changes.  Impression and Recommendations:    The patient was counselled, risk factors were discussed, anticipatory guidance given.  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 I filled out 2 physical forms today.   ___________________________________________ Gwen Her. Dianah Field, M.D., ABFM., CAQSM. Primary Care and Sports Medicine [place] [facility]  Adjunct Professor of [place] [employer] [facility] of Medicine

## 2021-07-28 LAB — COMPREHENSIVE METABOLIC PANEL
AG Ratio: 2.5 (calc) (ref 1.0–2.5)
ALT: 22 U/L (ref 9–46)
AST: 30 U/L (ref 10–40)
Albumin: 4.9 g/dL (ref 3.6–5.1)
Alkaline phosphatase (APISO): 33 U/L — ABNORMAL LOW (ref 36–130)
BUN: 21 mg/dL (ref 7–25)
CO2: 29 mmol/L (ref 20–32)
Calcium: 9.7 mg/dL (ref 8.6–10.3)
Chloride: 106 mmol/L (ref 98–110)
Creat: 1.09 mg/dL (ref 0.60–1.26)
Globulin: 2 g/dL (calc) (ref 1.9–3.7)
Glucose, Bld: 92 mg/dL (ref 65–139)
Potassium: 4.4 mmol/L (ref 3.5–5.3)
Sodium: 142 mmol/L (ref 135–146)
Total Bilirubin: 0.6 mg/dL (ref 0.2–1.2)
Total Protein: 6.9 g/dL (ref 6.1–8.1)

## 2021-07-28 LAB — QUANTIFERON-TB GOLD PLUS
Mitogen-NIL: >10 IU/mL
NIL: 0.05 IU/mL
QuantiFERON-TB Gold Plus: NEGATIVE
TB1-NIL: 0.01 IU/mL
TB2-NIL: 0.01 IU/mL

## 2021-07-28 LAB — URINALYSIS W MICROSCOPIC + REFLEX CULTURE
Bacteria, UA: NONE SEEN /HPF
Bilirubin Urine: NEGATIVE
Glucose, UA: NEGATIVE
Hgb urine dipstick: NEGATIVE
Hyaline Cast: NONE SEEN /LPF
Ketones, ur: NEGATIVE
Leukocyte Esterase: NEGATIVE
Nitrites, Initial: NEGATIVE
Protein, ur: NEGATIVE
RBC / HPF: NONE SEEN /HPF (ref 0–2)
Specific Gravity, Urine: 1.01 (ref 1.001–1.035)
Squamous Epithelial / HPF: NONE SEEN /HPF (ref <=5)
WBC, UA: NONE SEEN /HPF (ref 0–5)
pH: 7 (ref 5.0–8.0)

## 2021-07-28 LAB — CBC
HCT: 44.7 % (ref 38.5–50.0)
Hemoglobin: 14.9 g/dL (ref 13.2–17.1)
MCH: 29.2 pg (ref 27.0–33.0)
MCHC: 33.3 g/dL (ref 32.0–36.0)
MCV: 87.6 fL (ref 80.0–100.0)
MPV: 10.4 fL (ref 7.5–12.5)
Platelets: 167 10*3/uL (ref 140–400)
RBC: 5.1 10*6/uL (ref 4.20–5.80)
RDW: 12.1 % (ref 11.0–15.0)
WBC: 3 10*3/uL — ABNORMAL LOW (ref 3.8–10.8)

## 2021-07-28 LAB — LIPID PANEL
Cholesterol: 212 mg/dL — ABNORMAL HIGH (ref <200)
HDL: 58 mg/dL (ref >=40)
LDL Cholesterol (Calc): 139 mg/dL (calc) — ABNORMAL HIGH
Non-HDL Cholesterol (Calc): 154 mg/dL (calc) — ABNORMAL HIGH (ref <130)
Total CHOL/HDL Ratio: 3.7 (calc) (ref <5.0)
Triglycerides: 49 mg/dL (ref <150)

## 2021-07-28 LAB — TSH: TSH: 1.04 mIU/L (ref 0.40–4.50)

## 2021-07-28 LAB — CARDIO IQ (R) APOLIPOPROTEIN B: Apolipoprotein B: 104 mg/dL — ABNORMAL HIGH (ref <90)

## 2021-07-28 LAB — NO CULTURE INDICATED

## 2021-07-29 NOTE — Addendum Note (Signed)
Addended by: Narda Rutherford on: 07/29/2021 08:48 AM   Modules accepted: Orders

## 2021-08-03 NOTE — Telephone Encounter (Signed)
Fwd to Christal.

## 2022-04-14 ENCOUNTER — Encounter: Payer: Managed Care, Other (non HMO) | Admitting: Sports Medicine

## 2022-04-25 ENCOUNTER — Ambulatory Visit (INDEPENDENT_AMBULATORY_CARE_PROVIDER_SITE_OTHER): Payer: Managed Care, Other (non HMO) | Admitting: Sports Medicine

## 2022-04-25 ENCOUNTER — Encounter: Payer: Self-pay | Admitting: Sports Medicine

## 2022-04-25 VITALS — BP 111/69 | HR 65 | Ht 75.0 in | Wt 199.0 lb

## 2022-04-25 DIAGNOSIS — Z0000 Encounter for general adult medical examination without abnormal findings: Secondary | ICD-10-CM

## 2022-04-25 DIAGNOSIS — E782 Mixed hyperlipidemia: Secondary | ICD-10-CM

## 2022-04-25 DIAGNOSIS — E785 Hyperlipidemia, unspecified: Secondary | ICD-10-CM | POA: Diagnosis not present

## 2022-04-25 DIAGNOSIS — G4726 Circadian rhythm sleep disorder, shift work type: Secondary | ICD-10-CM

## 2022-04-25 NOTE — Progress Notes (Addendum)
?Subjective:   ? ?CC: Annual Physical Exam ? ?HPI:  ?This patient is here for their annual physical ? ?I reviewed the past medical history, family history, social history, surgical history, and allergies today and no changes were needed.  Please see the problem list section below in epic for further details. ? ?Past Medical History: ?No past medical history on file. ?Past Surgical History: ?Past Surgical History:  ?Procedure Laterality Date  ? TONSILLECTOMY  2000  ? ?Social History: ?Social History  ? ?Socioeconomic History  ? Marital status: Married  ?  Spouse name: Not on file  ? Number of children: Not on file  ? Years of education: Not on file  ? Highest education level: Not on file  ?Occupational History  ? Not on file  ?Tobacco Use  ? Smoking status: Never  ? Smokeless tobacco: Never  ?Substance and Sexual Activity  ? Alcohol use: Not on file  ? Drug use: Not on file  ? Sexual activity: Not on file  ?Other Topics Concern  ? Not on file  ?Social History Narrative  ? Not on file  ? ?Social Determinants of Health  ? ?Financial Resource Strain: Not on file  ?Food Insecurity: Not on file  ?Transportation Needs: Not on file  ?Physical Activity: Not on file  ?Stress: Not on file  ?Social Connections: Not on file  ? ?Family History: ?Family History  ?Problem Relation Age of Onset  ? Cancer Paternal Uncle   ? Hypertension Maternal Grandmother   ? Hypertension Maternal Grandfather   ? ?Allergies: ?No Known Allergies ?Medications: See med rec. ? ?Review of Systems: No headache, visual changes, nausea, vomiting, diarrhea, constipation, dizziness, abdominal pain, skin rash, fevers, chills, night sweats, swollen lymph nodes, weight loss, chest pain, body aches, joint swelling, muscle aches, shortness of breath, mood changes, visual or auditory hallucinations. ? ?Objective:   ? ?General: Well Developed, well nourished, and in no acute distress.  ?Neuro: Alert and oriented x3, extra-ocular muscles intact, sensation grossly  intact. Cranial nerves II through XII are intact, motor, sensory, and coordinative functions are all intact. ?HEENT: Normocephalic, atraumatic, pupils equal round reactive to light, neck supple, no masses, no lymphadenopathy, thyroid nonpalpable. Oropharynx, nasopharynx, external ear canals are unremarkable. ?Skin: Warm and dry, no rashes noted.  ?Cardiac: Regular rate and rhythm, no murmurs rubs or gallops.  ?Respiratory: Clear to auscultation bilaterally. Not using accessory muscles, speaking in full sentences.  ?Abdominal: Soft, nontender, nondistended, positive bowel sounds, no masses, no organomegaly.  ?Musculoskeletal: Shoulder, elbow, wrist, hip, knee, ankle stable, and with full range of motion. ? ?Impression and Recommendations:   ? ?The patient was counselled, risk factors were discussed, anticipatory guidance given. ? ?Annual physical exam ?Patrick Whitehead is here for his annual physical, he is doing well, we will check routine labs including apolipoprotein B per his request, he is agreeable to start a statin if his lipids are high. 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 ?We also filled out his biometrics form and he will fill out the blood sugar and cholesterol values tomorrow when he sees them on MyChart. 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 ? ?Sleep disorder, shift work ?Starting modafinil, we will start with 100 mg and bump up to 200 in a month if needed. ? ? ?___________________________________________ ?Gwen Her. Dianah Field, M.D.,  ABFM., CAQSM. ?Primary Care and Sports Medicine ?[place] ? ?Adjunct Professor of Family Medicine  ?[employer] ?

## 2022-04-25 NOTE — Assessment & Plan Note (Signed)
Patrick Whitehead is here for his annual physical, he is doing well, we will check routine labs including apolipoprotein B per his request, he is agreeable to start a statin if his lipids are high. 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 ?We also filled out his biometrics form and he will fill out the blood sugar and cholesterol values tomorrow when he sees them on MyChart. 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 ?

## 2022-04-26 ENCOUNTER — Encounter: Payer: Self-pay | Admitting: Sports Medicine

## 2022-04-26 DIAGNOSIS — G4726 Circadian rhythm sleep disorder, shift work type: Secondary | ICD-10-CM | POA: Insufficient documentation

## 2022-04-26 DIAGNOSIS — E782 Mixed hyperlipidemia: Secondary | ICD-10-CM | POA: Insufficient documentation

## 2022-04-26 MED ORDER — ATORVASTATIN CALCIUM 10 MG PO TABS: 10.0000 mg | ORAL_TABLET | Freq: Every day | ORAL | 3 refills | Status: DC

## 2022-04-26 MED ORDER — MODAFINIL 100 MG PO TABS: 100.0000 mg | ORAL_TABLET | Freq: Every day | ORAL | 0 refills | Status: DC

## 2022-04-26 NOTE — Addendum Note (Signed)
Addended by: Silverio Decamp on: 04/26/2022 04:42 PM ? ? Modules accepted: Orders ? ?

## 2022-04-26 NOTE — Assessment & Plan Note (Signed)
Starting modafinil, we will start with 100 mg and bump up to 200 in a month if needed. ?

## 2022-05-04 LAB — COMPREHENSIVE METABOLIC PANEL
AG Ratio: 1.8 (calc) (ref 1.0–2.5)
ALT: 14 U/L (ref 9–46)
AST: 13 U/L (ref 10–40)
Albumin: 4.6 g/dL (ref 3.6–5.1)
Alkaline phosphatase (APISO): 39 U/L (ref 36–130)
BUN: 23 mg/dL (ref 7–25)
CO2: 29 mmol/L (ref 20–32)
Calcium: 9.8 mg/dL (ref 8.6–10.3)
Chloride: 104 mmol/L (ref 98–110)
Creat: 1.26 mg/dL (ref 0.60–1.26)
Globulin: 2.6 g/dL (calc) (ref 1.9–3.7)
Glucose, Bld: 90 mg/dL (ref 65–139)
Potassium: 4.7 mmol/L (ref 3.5–5.3)
Sodium: 142 mmol/L (ref 135–146)
Total Bilirubin: 0.5 mg/dL (ref 0.2–1.2)
Total Protein: 7.2 g/dL (ref 6.1–8.1)

## 2022-05-04 LAB — LIPID PANEL
Cholesterol: 203 mg/dL — ABNORMAL HIGH (ref <200)
HDL: 57 mg/dL (ref >=40)
LDL Cholesterol (Calc): 130 mg/dL (calc) — ABNORMAL HIGH
Non-HDL Cholesterol (Calc): 146 mg/dL (calc) — ABNORMAL HIGH (ref <130)
Total CHOL/HDL Ratio: 3.6 (calc) (ref <5.0)
Triglycerides: 68 mg/dL (ref <150)

## 2022-05-04 LAB — TSH: TSH: 0.92 mIU/L (ref 0.40–4.50)

## 2022-05-04 LAB — CBC
HCT: 45.1 % (ref 38.5–50.0)
Hemoglobin: 15.3 g/dL (ref 13.2–17.1)
MCH: 30.2 pg (ref 27.0–33.0)
MCHC: 33.9 g/dL (ref 32.0–36.0)
MCV: 89.1 fL (ref 80.0–100.0)
MPV: 11.1 fL (ref 7.5–12.5)
Platelets: 172 10*3/uL (ref 140–400)
RBC: 5.06 10*6/uL (ref 4.20–5.80)
RDW: 12.2 % (ref 11.0–15.0)
WBC: 4.8 10*3/uL (ref 3.8–10.8)

## 2022-05-04 LAB — CARDIO IQ (R) APOLIPOPROTEIN B: Apolipoprotein B: 92 mg/dL — ABNORMAL HIGH (ref <90)

## 2022-05-05 ENCOUNTER — Telehealth: Payer: Self-pay

## 2022-05-05 NOTE — Telephone Encounter (Addendum)
Initiated Prior authorization [id number] '100MG'$  tablets Via: Covermymeds Case/Key:[id number]'$ [id number] Status: denied  as of 05/05/22 Reason:non formulary  Notified Pt via: Mychart

## 2022-05-12 ENCOUNTER — Other Ambulatory Visit: Payer: Self-pay

## 2022-05-12 DIAGNOSIS — E782 Mixed hyperlipidemia: Secondary | ICD-10-CM

## 2022-05-12 MED ORDER — ATORVASTATIN CALCIUM 10 MG PO TABS: 10.0000 mg | ORAL_TABLET | Freq: Every day | ORAL | 3 refills | Status: DC

## 2022-05-20 DIAGNOSIS — G4726 Circadian rhythm sleep disorder, shift work type: Secondary | ICD-10-CM

## 2022-06-07 MED ORDER — ARMODAFINIL 150 MG PO TABS: 150.0000 mg | ORAL_TABLET | Freq: Every day | ORAL | 0 refills | Status: DC

## 2022-06-09 ENCOUNTER — Telehealth: Payer: Self-pay

## 2022-06-09 NOTE — Telephone Encounter (Addendum)
Initiated Prior authorization [id number] '150MG'$  tablets Via: Covermymeds Case/Key: [id number]  Status: denied  as of 06/09/22 Reason:There is nothing to support that your patient's sleep and/or wake disturbance are not better  explained by other causes such as concurrent sleep disorder, medical or neurological disorder,  mental disorder, medication use, poor sleep hygiene, substance use disorder. There is no indication that the requested medication is being prescribed by, or in consultation with,  a neurologist, psychiatrist, or sleep specialist. Generic armodafinil and modafinil are considered medically necessary for the treatment of  Excessive Daytime Sleepiness Associated with Shift Work Sleep Disorder (SWSD) when the  individual meets all of the following criteria: 100. 20 years of age or older; 2. Documentation of  working at least five overnight shifts per month; 3. Documentation of insomnia and/or excessive  sleepiness, accompanied by a reduction of total sleep time, which is associated with a recurring  work schedule that overlaps the usual time for sleep; 4. Documentation of sleep log, completed on  work and free days, demonstrating a disturbed sleep and wake pattern; 5. Documentation that the  sleep and/or wake disturbance cannot be better explained by another cause (for example,  concurrent sleep disorder, medical or neurological disorder, mental disorder, medication use, poor  sleep hygiene, substance use disorder); and 6. Medication is being prescribed by, or in  consultation with, a neurologist, psychiatrist or sleep specialist. Continuation of armodafinil (Nuvigil)  and modafinil (Provigil) is considered medically necessary for continued use Notified Pt via: Mychart

## 2022-07-11 ENCOUNTER — Encounter: Payer: Self-pay | Admitting: Sports Medicine

## 2022-07-11 DIAGNOSIS — G4726 Circadian rhythm sleep disorder, shift work type: Secondary | ICD-10-CM

## 2022-07-11 MED ORDER — MODAFINIL 100 MG PO TABS: 100.0000 mg | ORAL_TABLET | Freq: Every day | ORAL | 0 refills | Status: DC

## 2022-07-11 MED ORDER — ARMODAFINIL 150 MG PO TABS: 150.0000 mg | ORAL_TABLET | Freq: Every day | ORAL | 0 refills | Status: DC

## 2022-07-13 MED ORDER — MODAFINIL 100 MG PO TABS: 100.0000 mg | ORAL_TABLET | Freq: Every day | ORAL | 0 refills | Status: DC

## 2022-07-13 NOTE — Telephone Encounter (Signed)
Done

## 2022-07-13 NOTE — Addendum Note (Signed)
Addended by: Narda Rutherford on: 07/13/2022 11:08 AM   Modules accepted: Orders

## 2022-07-13 NOTE — Addendum Note (Signed)
Addended by: Silverio Decamp on: 07/13/2022 11:42 AM   Modules accepted: Orders

## 2023-02-13 ENCOUNTER — Other Ambulatory Visit: Payer: Self-pay | Admitting: Sports Medicine

## 2023-02-13 DIAGNOSIS — G4726 Circadian rhythm sleep disorder, shift work type: Secondary | ICD-10-CM

## 2023-02-13 MED ORDER — MODAFINIL 100 MG PO TABS: 100.0000 mg | ORAL_TABLET | Freq: Every day | ORAL | 0 refills | Status: DC

## 2023-02-13 NOTE — Telephone Encounter (Signed)
Requesting rx rf of provigil 156m Last written 07/13/2022 Last OV 04/25/2022  No scheduled upcoming appt

## 2023-07-20 ENCOUNTER — Other Ambulatory Visit: Payer: Self-pay | Admitting: Sports Medicine

## 2023-07-20 DIAGNOSIS — E782 Mixed hyperlipidemia: Secondary | ICD-10-CM

## 2023-07-21 ENCOUNTER — Encounter: Payer: Self-pay | Admitting: Sports Medicine

## 2023-07-21 ENCOUNTER — Other Ambulatory Visit: Payer: Self-pay

## 2023-07-21 DIAGNOSIS — E782 Mixed hyperlipidemia: Secondary | ICD-10-CM

## 2023-07-21 MED ORDER — ATORVASTATIN CALCIUM 10 MG PO TABS
10.0000 mg | ORAL_TABLET | Freq: Every day | ORAL | 0 refills | Status: DC
Start: 2023-07-21 — End: 2023-07-26

## 2023-07-26 MED ORDER — ATORVASTATIN CALCIUM 10 MG PO TABS
10.0000 mg | ORAL_TABLET | Freq: Every day | ORAL | 0 refills | Status: DC
Start: 2023-07-26 — End: 2023-10-23

## 2023-07-26 NOTE — Addendum Note (Signed)
Addended by: Chalmers Cater on: 07/26/2023 09:40 AM   Modules accepted: Orders

## 2023-08-21 ENCOUNTER — Ambulatory Visit (INDEPENDENT_AMBULATORY_CARE_PROVIDER_SITE_OTHER): Payer: Managed Care, Other (non HMO) | Admitting: Sports Medicine

## 2023-08-21 ENCOUNTER — Encounter: Payer: Self-pay | Admitting: Sports Medicine

## 2023-08-21 VITALS — BP 135/87 | HR 68 | Ht 75.0 in | Wt 194.0 lb

## 2023-08-21 DIAGNOSIS — E782 Mixed hyperlipidemia: Secondary | ICD-10-CM | POA: Diagnosis not present

## 2023-08-21 DIAGNOSIS — Z0000 Encounter for general adult medical examination without abnormal findings: Secondary | ICD-10-CM

## 2023-08-21 DIAGNOSIS — G4726 Circadian rhythm sleep disorder, shift work type: Secondary | ICD-10-CM | POA: Diagnosis not present

## 2023-08-21 MED ORDER — MODAFINIL 100 MG PO TABS
100.0000 mg | ORAL_TABLET | Freq: Every day | ORAL | 0 refills | Status: DC
Start: 2023-08-21 — End: 2023-10-10

## 2023-08-21 NOTE — Assessment & Plan Note (Signed)
Annual physical as above, checking routine labs. Filled out the adoption forms today.

## 2023-08-21 NOTE — Assessment & Plan Note (Signed)
LDL goal less than 100, we can increase his atorvastatin if not at goal.

## 2023-08-21 NOTE — Progress Notes (Signed)
  Subjective:    CC: Annual Physical Exam  HPI:  This patient is here for their annual physical  I reviewed the past medical history, family history, social history, surgical history, and allergies today and no changes were needed.  Please see the problem list section below in epic for further details.  Past Medical History: No past medical history on file. Past Surgical History: Past Surgical History:  Procedure Laterality Date   TONSILLECTOMY  2000   Social History: Social History   Socioeconomic History   Marital status: Married    Spouse name: Not on file   Number of children: Not on file   Years of education: Not on file   Highest education level: Not on file  Occupational History   Not on file  Tobacco Use   Smoking status: Never   Smokeless tobacco: Never  Substance and Sexual Activity   Alcohol use: Not on file   Drug use: Not on file   Sexual activity: Not on file  Other Topics Concern   Not on file  Social History Narrative   Not on file   Social Determinants of Health   Financial Resource Strain: Not on file  Food Insecurity: Not on file  Transportation Needs: Not on file  Physical Activity: Not on file  Stress: Not on file  Social Connections: Unknown (04/25/2022)   Received from [facility], [facility]   Social Network    Social Network: Not on file   Family History: Family History  Problem Relation Age of Onset   Cancer Paternal Uncle    Hypertension Maternal Grandmother    Hypertension Maternal Grandfather    Allergies: No Known Allergies Medications: See med rec.  Review of Systems: No headache, visual changes, nausea, vomiting, diarrhea, constipation, dizziness, abdominal pain, skin rash, fevers, chills, night sweats, swollen lymph nodes, weight loss, chest pain, body aches, joint swelling, muscle aches, shortness of breath, mood changes, visual or auditory hallucinations.  Objective:    General: Well Developed, well nourished, and  in no acute distress.  Neuro: Alert and oriented x3, extra-ocular muscles intact, sensation grossly intact. Cranial nerves II through XII are intact, motor, sensory, and coordinative functions are all intact. HEENT: Normocephalic, atraumatic, pupils equal round reactive to light, neck supple, no masses, no lymphadenopathy, thyroid nonpalpable. Oropharynx, nasopharynx, external ear canals are unremarkable. Skin: Warm and dry, no rashes noted.  Cardiac: Regular rate and rhythm, no murmurs rubs or gallops.  Respiratory: Clear to auscultation bilaterally. Not using accessory muscles, speaking in full sentences.  Abdominal: Soft, nontender, nondistended, positive bowel sounds, no masses, no organomegaly.  Musculoskeletal: Shoulder, elbow, wrist, hip, knee, ankle stable, and with full range of motion.  Impression and Recommendations:    The patient was counselled, risk factors were discussed, anticipatory guidance given.  Annual physical exam Annual physical as above, checking routine labs. Filled out the adoption forms today.  Mixed hyperlipidemia LDL goal less than 100, we can increase his atorvastatin if not at goal.   ____________________________________________ Ihor Austin. Benjamin Stain, M.D., ABFM., CAQSM., AME. Primary Care and Sports Medicine  [facility]  Adjunct Professor of Family Medicine  [place] [employer] [facility] [employer]  Restaurant manager, fast food

## 2023-08-22 LAB — CBC
Hematocrit: 44.6 % (ref 37.5–51.0)
Hemoglobin: 14.4 g/dL (ref 13.0–17.7)
MCH: 28.7 pg (ref 26.6–33.0)
MCHC: 32.3 g/dL (ref 31.5–35.7)
MCV: 89 fL (ref 79–97)
Platelets: 148 10*3/uL — ABNORMAL LOW (ref 150–450)
RBC: 5.01 x10E6/uL (ref 4.14–5.80)
RDW: 12.6 % (ref 11.6–15.4)
WBC: 4.4 10*3/uL (ref 3.4–10.8)

## 2023-08-22 LAB — COMPREHENSIVE METABOLIC PANEL
ALT: 19 IU/L (ref 0–44)
AST: 15 IU/L (ref 0–40)
Albumin: 4.5 g/dL (ref 4.1–5.1)
Alkaline Phosphatase: 42 IU/L — ABNORMAL LOW (ref 44–121)
BUN/Creatinine Ratio: 23 — ABNORMAL HIGH (ref 9–20)
BUN: 25 mg/dL — ABNORMAL HIGH (ref 6–20)
Bilirubin Total: 0.4 mg/dL (ref 0.0–1.2)
CO2: 26 mmol/L (ref 20–29)
Calcium: 9.6 mg/dL (ref 8.7–10.2)
Chloride: 102 mmol/L (ref 96–106)
Creatinine, Ser: 1.07 mg/dL (ref 0.76–1.27)
Globulin, Total: 2.3 g/dL (ref 1.5–4.5)
Glucose: 90 mg/dL (ref 70–99)
Potassium: 4.3 mmol/L (ref 3.5–5.2)
Sodium: 140 mmol/L (ref 134–144)
Total Protein: 6.8 g/dL (ref 6.0–8.5)
eGFR: 92 mL/min/{1.73_m2} (ref >59)

## 2023-08-22 LAB — LIPID PANEL
Chol/HDL Ratio: 2.7 ratio (ref 0.0–5.0)
Cholesterol, Total: 152 mg/dL (ref 100–199)
HDL: 56 mg/dL (ref >39)
LDL Chol Calc (NIH): 84 mg/dL (ref 0–99)
Triglycerides: 58 mg/dL (ref 0–149)
VLDL Cholesterol Cal: 12 mg/dL (ref 5–40)

## 2023-08-22 LAB — TSH: TSH: 0.657 u[IU]/mL (ref 0.450–4.500)

## 2023-08-22 LAB — APOLIPOPROTEIN B: Apolipoprotein B: 66 mg/dL (ref <90)

## 2023-08-22 LAB — HEMOGLOBIN A1C
Est. average glucose Bld gHb Est-mCnc: 105 mg/dL
Hgb A1c MFr Bld: 5.3 % (ref 4.8–5.6)

## 2023-09-23 ENCOUNTER — Other Ambulatory Visit: Payer: Self-pay | Admitting: Sports Medicine

## 2023-09-23 DIAGNOSIS — G4726 Circadian rhythm sleep disorder, shift work type: Secondary | ICD-10-CM

## 2023-09-26 ENCOUNTER — Encounter: Payer: Self-pay | Admitting: Sports Medicine

## 2023-09-26 DIAGNOSIS — G4726 Circadian rhythm sleep disorder, shift work type: Secondary | ICD-10-CM

## 2023-10-10 MED ORDER — MODAFINIL 100 MG PO TABS
100.0000 mg | ORAL_TABLET | Freq: Every day | ORAL | 0 refills | Status: DC
Start: 2023-10-10 — End: 2024-07-22

## 2023-10-23 ENCOUNTER — Other Ambulatory Visit: Payer: Self-pay | Admitting: Sports Medicine

## 2023-10-23 DIAGNOSIS — E782 Mixed hyperlipidemia: Secondary | ICD-10-CM

## 2024-03-28 ENCOUNTER — Encounter (INDEPENDENT_AMBULATORY_CARE_PROVIDER_SITE_OTHER): Payer: Self-pay | Admitting: Sports Medicine

## 2024-03-28 DIAGNOSIS — Z0289 Encounter for other administrative examinations: Secondary | ICD-10-CM | POA: Diagnosis not present

## 2024-03-28 NOTE — Telephone Encounter (Signed)

## 2024-04-23 ENCOUNTER — Encounter: Payer: Self-pay | Admitting: Sports Medicine

## 2024-04-23 ENCOUNTER — Ambulatory Visit (INDEPENDENT_AMBULATORY_CARE_PROVIDER_SITE_OTHER): Admitting: Sports Medicine

## 2024-04-23 VITALS — BP 130/76 | HR 80 | Resp 20 | Ht 75.0 in | Wt 200.0 lb

## 2024-04-23 DIAGNOSIS — E782 Mixed hyperlipidemia: Secondary | ICD-10-CM | POA: Diagnosis not present

## 2024-04-23 DIAGNOSIS — Z0000 Encounter for general adult medical examination without abnormal findings: Secondary | ICD-10-CM | POA: Diagnosis not present

## 2024-04-23 DIAGNOSIS — Z23 Encounter for immunization: Secondary | ICD-10-CM | POA: Diagnosis not present

## 2024-04-23 DIAGNOSIS — N289 Disorder of kidney and ureter, unspecified: Secondary | ICD-10-CM

## 2024-04-23 NOTE — Progress Notes (Signed)
  Subjective:    CC: Annual Physical Exam  HPI:  This patient is here for their annual physical  I reviewed the past medical history, family history, social history, surgical history, and allergies today and no changes were needed.  Please see the problem list section below in epic for further details.  Past Medical History: History reviewed. No pertinent past medical history. Past Surgical History: Past Surgical History:  Procedure Laterality Date   TONSILLECTOMY  2000   Social History: Social History   Socioeconomic History   Marital status: Married    Spouse name: Not on file   Number of children: Not on file   Years of education: Not on file   Highest education level: Not on file  Occupational History   Not on file  Tobacco Use   Smoking status: Never   Smokeless tobacco: Never  Substance and Sexual Activity   Alcohol use: Not on file   Drug use: Not on file   Sexual activity: Not on file  Other Topics Concern   Not on file  Social History Narrative   Not on file   Social Drivers of Health   Financial Resource Strain: Not on file  Food Insecurity: Not on file  Transportation Needs: Not on file  Physical Activity: Not on file  Stress: Not on file  Social Connections: Unknown (04/25/2022)   [facility]   Social Network    Social Network: Not on file   Family History: Family History  Problem Relation Age of Onset   Cancer Paternal Uncle    Hypertension Maternal Grandmother    Hypertension Maternal Grandfather    Allergies: No Known Allergies Medications: See med rec.  Review of Systems: No headache, visual changes, nausea, vomiting, diarrhea, constipation, dizziness, abdominal pain, skin rash, fevers, chills, night sweats, swollen lymph nodes, weight loss, chest pain, body aches, joint swelling, muscle aches, shortness of breath, mood changes, visual or auditory hallucinations.  Objective:    General: Well Developed, well  nourished, and in no acute distress.  Neuro: Alert and oriented x3, extra-ocular muscles intact, sensation grossly intact. Cranial nerves II through XII are intact, motor, sensory, and coordinative functions are all intact. HEENT: Normocephalic, atraumatic, pupils equal round reactive to light, neck supple, no masses, no lymphadenopathy, thyroid nonpalpable. Oropharynx, nasopharynx, external ear canals are unremarkable. Skin: Warm and dry, no rashes noted.  Cardiac: Regular rate and rhythm, no murmurs rubs or gallops.  Respiratory: Clear to auscultation bilaterally. Not using accessory muscles, speaking in full sentences.  Abdominal: Soft, nontender, nondistended, positive bowel sounds, no masses, no organomegaly.  Musculoskeletal: Shoulder, elbow, wrist, hip, knee, ankle stable, and with full range of motion.  Impression and Recommendations:    The patient was counselled, risk factors were discussed, anticipatory guidance given.  Annual physical exam Nonfasting annual physical. We signed off on some additional forms today. Return in 1 year.  Renal insufficiency Incidentally noted renal insufficiency, the first order of business is aggressive hydration and to recheck this in about 2 weeks.  I am also going to go ahead and order the renal ultrasound.   ____________________________________________ Joselyn Nicely. Sandy Crumb, M.D., ABFM., CAQSM., AME. Primary Care and Sports Medicine [place] [facility]  Adjunct Professor of [facility] [employer] Medicine  [employer]  Restaurant manager, fast food

## 2024-04-23 NOTE — Assessment & Plan Note (Signed)
 Nonfasting annual physical. We signed off on some additional forms today. Return in 1 year.

## 2024-04-24 ENCOUNTER — Encounter: Payer: Self-pay | Admitting: Sports Medicine

## 2024-04-24 DIAGNOSIS — N289 Disorder of kidney and ureter, unspecified: Secondary | ICD-10-CM | POA: Insufficient documentation

## 2024-04-24 LAB — COMPREHENSIVE METABOLIC PANEL WITH GFR
ALT: 25 IU/L (ref 0–44)
AST: 21 IU/L (ref 0–40)
Albumin: 4.8 g/dL (ref 4.1–5.1)
Alkaline Phosphatase: 41 IU/L — ABNORMAL LOW (ref 44–121)
BUN/Creatinine Ratio: 17 (ref 9–20)
BUN: 29 mg/dL — ABNORMAL HIGH (ref 6–20)
Bilirubin Total: 0.6 mg/dL (ref 0.0–1.2)
CO2: 25 mmol/L (ref 20–29)
Calcium: 9.7 mg/dL (ref 8.7–10.2)
Chloride: 99 mmol/L (ref 96–106)
Creatinine, Ser: 1.67 mg/dL — ABNORMAL HIGH (ref 0.76–1.27)
Globulin, Total: 1.9 g/dL (ref 1.5–4.5)
Glucose: 87 mg/dL (ref 70–99)
Potassium: 4.5 mmol/L (ref 3.5–5.2)
Sodium: 138 mmol/L (ref 134–144)
Total Protein: 6.7 g/dL (ref 6.0–8.5)
eGFR: 53 mL/min/{1.73_m2} — ABNORMAL LOW (ref >59)

## 2024-04-24 LAB — CBC
Hematocrit: 46 % (ref 37.5–51.0)
Hemoglobin: 15.2 g/dL (ref 13.0–17.7)
MCH: 29.3 pg (ref 26.6–33.0)
MCHC: 33 g/dL (ref 31.5–35.7)
MCV: 89 fL (ref 79–97)
Platelets: 162 10*3/uL (ref 150–450)
RBC: 5.18 x10E6/uL (ref 4.14–5.80)
RDW: 12.4 % (ref 11.6–15.4)
WBC: 4.1 10*3/uL (ref 3.4–10.8)

## 2024-04-24 LAB — LIPID PANEL
Chol/HDL Ratio: 3 ratio (ref 0.0–5.0)
Cholesterol, Total: 153 mg/dL (ref 100–199)
HDL: 51 mg/dL (ref >39)
LDL Chol Calc (NIH): 91 mg/dL (ref 0–99)
Triglycerides: 55 mg/dL (ref 0–149)
VLDL Cholesterol Cal: 11 mg/dL (ref 5–40)

## 2024-04-24 LAB — HEMOGLOBIN A1C
Est. average glucose Bld gHb Est-mCnc: 108 mg/dL
Hgb A1c MFr Bld: 5.4 % (ref 4.8–5.6)

## 2024-04-24 LAB — APOLIPOPROTEIN B: Apolipoprotein B: 71 mg/dL (ref <90)

## 2024-04-24 LAB — TSH: TSH: 0.984 u[IU]/mL (ref 0.450–4.500)

## 2024-04-24 NOTE — Assessment & Plan Note (Signed)
 Incidentally noted renal insufficiency, the first order of business is aggressive hydration and to recheck this in about 2 weeks.  I am also going to go ahead and order the renal ultrasound.

## 2024-04-24 NOTE — Addendum Note (Signed)
 Addended by: Gean Keels on: 04/24/2024 10:21 AM   Modules accepted: Orders

## 2024-05-10 ENCOUNTER — Ambulatory Visit: Payer: Self-pay | Admitting: Sports Medicine

## 2024-05-10 LAB — RENAL FUNCTION PANEL
Albumin: 4.3 g/dL (ref 4.1–5.1)
BUN/Creatinine Ratio: 17 (ref 9–20)
BUN: 19 mg/dL (ref 6–20)
CO2: 22 mmol/L (ref 20–29)
Calcium: 9.4 mg/dL (ref 8.7–10.2)
Chloride: 101 mmol/L (ref 96–106)
Creatinine, Ser: 1.12 mg/dL (ref 0.76–1.27)
Glucose: 91 mg/dL (ref 70–99)
Phosphorus: 3.4 mg/dL (ref 2.8–4.1)
Potassium: 4.4 mmol/L (ref 3.5–5.2)
Sodium: 138 mmol/L (ref 134–144)
eGFR: 86 mL/min/{1.73_m2} (ref >59)

## 2024-06-24 ENCOUNTER — Encounter: Payer: Self-pay | Admitting: Sports Medicine

## 2024-07-17 ENCOUNTER — Encounter: Payer: Self-pay | Admitting: Sports Medicine

## 2024-07-22 ENCOUNTER — Encounter: Payer: Self-pay | Admitting: Sports Medicine

## 2024-07-22 DIAGNOSIS — G4726 Circadian rhythm sleep disorder, shift work type: Secondary | ICD-10-CM

## 2024-07-22 MED ORDER — MODAFINIL 100 MG PO TABS: 100.0000 mg | ORAL_TABLET | Freq: Every day | ORAL | 0 refills | Status: AC

## 2024-07-22 NOTE — Telephone Encounter (Signed)
 Dr. Karie Schwalbe,  Please see mychart message sent by pt and advise.

## 2024-08-27 ENCOUNTER — Encounter: Payer: Self-pay | Admitting: Sports Medicine

## 2024-10-17 ENCOUNTER — Other Ambulatory Visit: Payer: Self-pay

## 2024-10-17 DIAGNOSIS — E782 Mixed hyperlipidemia: Secondary | ICD-10-CM

## 2024-10-17 MED ORDER — ATORVASTATIN CALCIUM 10 MG PO TABS
10.0000 mg | ORAL_TABLET | Freq: Every day | ORAL | 0 refills | Status: AC
Start: 2024-10-17 — End: ?
